# Patient Record
Sex: Female | Born: 1990 | Race: Black or African American | Hispanic: No | Marital: Single | Smoking: Never smoker
Health system: Southern US, Community
[De-identification: ages and names within clinical notes are randomized; demographics above are authoritative.]

## PROBLEM LIST (undated history)

## (undated) HISTORY — PX: HERNIA REPAIR: SHX51

## (undated) HISTORY — PX: TOE SURGERY: SHX1073

---
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
## 2010-06-28 ENCOUNTER — Emergency Department (HOSPITAL_COMMUNITY)
Admission: EM | Admit: 2010-06-28 | Discharge: 2010-06-28 | Disposition: A | Discharge status: Home / Self Care | Payer: Self-pay | Attending: Emergency Medicine | Admitting: Emergency Medicine

## 2010-06-28 DIAGNOSIS — M25569 Pain in unspecified knee: Secondary | ICD-10-CM | POA: Insufficient documentation

## 2010-06-28 DIAGNOSIS — M25469 Effusion, unspecified knee: Secondary | ICD-10-CM | POA: Insufficient documentation

## 2010-06-28 DIAGNOSIS — IMO0002 Reserved for concepts with insufficient information to code with codable children: Secondary | ICD-10-CM | POA: Insufficient documentation

## 2010-06-28 DIAGNOSIS — Y929 Unspecified place or not applicable: Secondary | ICD-10-CM | POA: Insufficient documentation

## 2010-06-28 DIAGNOSIS — M25669 Stiffness of unspecified knee, not elsewhere classified: Secondary | ICD-10-CM | POA: Insufficient documentation

## 2010-06-28 DIAGNOSIS — X500XXA Overexertion from strenuous movement or load, initial encounter: Secondary | ICD-10-CM | POA: Insufficient documentation

## 2010-07-15 NOTE — Discharge Summary (Signed)
Suzanne Miles, SIPPEL NO.:  [id number]   MEDICAL RECORD NO.:  [id number]          PATIENT TYPE:  INP   LOCATION:  5125                         FACILITY:  [facility]   PHYSICIAN:  Altha Harm, MDDATE OF BIRTH:  03-07-1990   DATE OF ADMISSION:  11/11/2007  DATE OF DISCHARGE:  11/16/2007                               DISCHARGE SUMMARY   DISCHARGE DISPOSITION:  Home.   FINAL DISCHARGE DIAGNOSES:  1. Bilateral acute pyelonephritis.  2. E. coli bacteremia secondary to bilateral pyelonephritis.  3. Dehydration.  4. Electrolyte imbalance.  5. Fever, resolved.   DISCHARGE MEDICATIONS:  Ciprofloxacin 500 mg p.o. b.i.d. times 8 days.   CONSULTANTS:  None.   PROCEDURE:  None.   DIAGNOSTIC STUDIES:  1. Chest x-ray 2 view which shows no acute abnormality, done on      admission.  2. CT abdomen and pelvis done on admission which shows bilateral      pyelonephritis.  No trace right pleural effusions.  3. Repeat chest x-ray 2 view which shows low lung volumes.  Bilateral      pleural effusions and sloughing airspace opacity in the posterior      lower lobes suspicious for bilateral pneumonia although lower lobe      atelectasis is a possibility  4. CT abdomen and pelvis done on September 15 which shows increased      bilateral pleural effusions with new, fluffy alveolar opacities in      the posterior lower lobe suspicious for pneumonia, sequelae of      aspiration or atelectasis are less likely considerations.  5. Bilateral pyelonephritis with pararenal stranding.  No abscess.  6. Increased perihepatic and right pericolic gutter ascites.      Presumably reactive to #2.  No acute findings in the pelvis.   PERTINENT LABORATORY STUDIES:  1. Blood culture done on admission which shows positive E-coli which      is pansensitive, in particular to ciprofloxacin.  2. Urine culture showing E. coli sensitive to ciprofloxacin.  White      blood cell count 8.1, hemoglobin  9.5, hematocrit 28.4, platelet      count 251, sodium 138, potassium 3.8, chloride 106, bicarb 25, BUN      3, creatinine 1.06 at the day of discharge.   HOSPITAL COURSE:  The patient was admitted and started on Fortaz and  ciprofloxacin for presumably pyelonephritis.   Blood cultures were drawn which showed bacteremia.  The patient received  a total of 6 days of antibiotics which were appropriate as the cultures  were sensitive to ciprofloxacin.  The patient has been afebrile for last  24 hours and is being discharged home on ciprofloxacin 500 mg b.i.d. to  complete another 8 days.  The patient has been instructed that she needs  to keep very well hydrated, and that if she has any worsening CVA  tenderness or fever she is to report to her physician or report back to  the hospital.  The patient's chest x-ray did show opacities; however,  the patient has no respiratory symptoms and this should be treated  with  the regimen of antibiotics including both Fortaz and ciprofloxacin.  The  patient at this time is without any need for oxygen and does not  demonstrate any tachypnea.  She is ambulating without any difficulty.   Anemia.  The patient does demonstrate anemia which appears to be  normocytic in dynamics.  This should be worked up as an outpatient.  It  is likely due to the patient who is menstruating.  However, I would  recommend that the patient has a workup as an outpatient to further  elucidate the anemia.   DIET:  Dietary restrictions are none.  However, the patient is  encouraged to drink lots of clear fluids.   ACTIVITY:  Physical restrictions:  None.   FOLLOW UP:  The patient is to follow up with her GYN who serves as her  primary care physician in [facility] within 1 week or as needed.      Altha Harm, MD  Electronically Signed     MAM/MEDQ  D:  11/16/2007  T:  11/16/2007  Job:  [id number]

## 2010-07-15 NOTE — Group Therapy Note (Signed)
NAME:  Suzanne Miles, Suzanne Miles NO.:  [id number]   MEDICAL RECORD NO.:  [id number]          PATIENT TYPE:  INP   LOCATION:  5125                         FACILITY:  [facility]   PHYSICIAN:  Eduard Clos, MDDATE OF BIRTH:  [birth year]                                 PROGRESS NOTE   An 20 year old female with no significant past medical history who  presented with abdominal pain, nausea and vomiting.  As mentioned, the  patient had a UA which showed  a large amount of leukocyte esterase and  CT abdomen and pelvis showed bilateral pyelonephritis.  The patient also  had a fever of 103 on admission.  The patient was admitted to the  medical floor, started on IV fluids and started on empiric antibiotics.  Blood cultures were drawn and showed E. coli which was sensitive to  Cipro.  Urine cultures also grew E. coli.  Repeat blood cultures have  shown no growth so far.  The patient is still having fevers of 102.  A  repeat CAT scan of the abdomen and pelvis has been ordered with contrast  and a chest x-ray will also be ordered to see if there is any other  source of fever.  We will continue present antibiotics and further  recommendation after CAT scan of abdomen and pelvis and chest x-ray.   FINAL DIAGNOSES:  1. Escherichia coli bacteremia secondary to bilateral pyelonephritis.  2. Dehydration.  3. Electrolyte imbalance.  4. Fever.   PLAN:  Further recommendation based on the CAT scan of the abdomen and pelvis  findings, along with the chest x-ray findings.  Presently, we will  continue the antibiotics.  She is on Cipro and ceftizoxime.  If the  fever persists and we will need infectious disease consult for further  recommendations.      Eduard Clos, MD  Electronically Signed     ANK/MEDQ  D:  11/15/2007  T:  11/15/2007  Job:  [id number]

## 2010-07-15 NOTE — Group Therapy Note (Signed)
NAME:  MUREL, WIGLE NO.:  [id number]   MEDICAL RECORD NO.:  [id number]          PATIENT TYPE:  INP   LOCATION:  5125                         FACILITY:  [facility]   PHYSICIAN:  Eduard Clos, MDDATE OF BIRTH:  22-Jun-1990                                 PROGRESS NOTE   An 20 year old female with no significant past medical history,  presented with complaints of abdominal pain, nausea and vomiting.  The  patient also had a fever of 103.1.  The patient had a UA which showed a  large amount of leukocyte esterase and rbc's and many bacteria.  The  patient had a CT scan of the abdomen and pelvis which showed bilateral  pyelonephritis.  Patient admitted to a medical floor, was started on  empiric antibiotics.  Blood cultures grew E. coli which was sensitive to  Cipro.  Urine cultures also grew E. coli.  At this time the patient is  still having fever but repeat blood cultures did not show any growth so  far.  Since the patient is still having fevers around 102, a repeat CT  scan of the abdomen and pelvis with contrast will be ordered now to  assess for any abscess or any worsening of the pyelonephritis.  We will  also order a chest x-ray to rule out any other source of fever.   FINAL DIAGNOSES:  1. Escherichia coli bacteremia from bilateral pyelonephritis.  2. Persistent fever.  3. Electrolyte imbalance, corrected.   PLAN:  A CT of the abdomen and pelvis with contrast and a portable chest x-ray  will be ordered.      Eduard Clos, MD  Electronically Signed     ANK/MEDQ  D:  11/15/2007  T:  11/15/2007  Job:  [id number]

## 2010-07-15 NOTE — H&P (Signed)
[name], [name] NO.:  [id number]   MEDICAL RECORD NO.:  [id number]          PATIENT TYPE:  EMS   LOCATION:  MINO                         FACILITY:  [facility]   PHYSICIAN:  Ladell Pier, M.D.   DATE OF BIRTH:  [birth year]   DATE OF ADMISSION:  11/11/2007  DATE OF DISCHARGE:                              HISTORY & PHYSICAL   CHIEF COMPLAINT:  Kidney infection.   HISTORY OF PRESENT ILLNESS:  The patient is an 20 year old African  American female without any significant past medical history.  The  patient states that she normally sees OB/GYN in [facility].  She had a C-  section on October 04, 2007.  For the past 3 days she has been having  abdominal pain, nausea, vomiting.  She went to see her OB/GYN and was  told she had a kidney infection, and was told to go to [facility]  [place].  She came here to [facility].  She complains  of abdominal pain, chest pain when she coughs; no shortness of breath.  She complains of tenderness in her back.   PAST MEDICAL HISTORY:  None, except for recent C-section on October 04, 2007.   FAMILY HISTORY:  Mother and father both healthy.   SOCIAL HISTORY:  No tobacco or alcohol use.  She is single.  She just  had child by C-section on October 04, 2007.  She recently graduated from  high school.   MEDICATIONS:  None.   ALLERGIES:  NO KNOWN DRUG ALLERGIES.   REVIEW OF SYSTEMS:  As per stated in the HPI.   PHYSICAL EXAMINATION:  Temperature 103.1, blood pressure 107/62, pulse  125, respirations 26, pulse oximetry 98% on room air.  HEENT:  Normocephalic, atraumatic.  Pupils reactive to light.  Throat  without erythema.  CARDIOVASCULAR:  She is tachycardic.  LUNGS:  Clear bilaterally.  No wheezes, rhonchi or rales.  ABDOMEN:  Soft, diffusely tender with positive bowel sounds.  Positive  for CVA tenderness bilaterally.  EXTREMITIES:  There are 2+ DP pulses bilaterally.  No edema.   LABS:  Urinalysis with large  leukocyte esterase.  Moderate amount of  hemoglobin.  7-10 RBCs and many bacteria.  WBC 12.0, hemoglobin 11, MCV  82.6, platelets 184.  Sodium 136, potassium 3.3, chloride 101, CO2 26,  glucose 160, BUN 11, creatinine 1.25.   ASSESSMENT AND PLAN:  1. Acute pyelonephritis.  2. Abdominal pain.  3. Nausea and vomiting.  4. Leukocytosis.  5. Normocytic anemia.  6. Tachycardia.  7. Mildly elevated glucose.   Will admit the patient to the hospital; place on IV fluids at 125 mL an  hour.  Will get blood cultures x2. Will get urine cultures.  Will get a  CT scan of the abdomen and pelvis, secondary to her recently having C-  section and diffuse tenderness in the abdomen.  Will replete her  potassium.  Will give her Dilaudid or oxycodone for pain control.  We  will also get a chest x-ray with her complaint of chest pain when she  coughs.  Ladell Pier, M.D.  Electronically Signed     NJ/MEDQ  D:  11/11/2007  T:  11/11/2007  Job:  [id number]

## 2010-07-23 ENCOUNTER — Emergency Department (HOSPITAL_COMMUNITY)
Admission: EM | Admit: 2010-07-23 | Discharge: 2010-07-24 | Disposition: A | Discharge status: Home / Self Care | Payer: Self-pay | Attending: Emergency Medicine | Admitting: Emergency Medicine

## 2010-07-23 DIAGNOSIS — N72 Inflammatory disease of cervix uteri: Secondary | ICD-10-CM | POA: Insufficient documentation

## 2010-07-23 DIAGNOSIS — N898 Other specified noninflammatory disorders of vagina: Secondary | ICD-10-CM | POA: Insufficient documentation

## 2010-07-23 DIAGNOSIS — O2390 Unspecified genitourinary tract infection in pregnancy, unspecified trimester: Secondary | ICD-10-CM | POA: Insufficient documentation

## 2010-07-23 DIAGNOSIS — R109 Unspecified abdominal pain: Secondary | ICD-10-CM | POA: Insufficient documentation

## 2010-07-23 DIAGNOSIS — O9989 Other specified diseases and conditions complicating pregnancy, childbirth and the puerperium: Secondary | ICD-10-CM | POA: Insufficient documentation

## 2010-07-24 LAB — WET PREP, GENITAL

## 2010-07-24 LAB — URINALYSIS, ROUTINE W REFLEX MICROSCOPIC
Bilirubin Urine: NEGATIVE
Nitrite: NEGATIVE
Specific Gravity, Urine: 1.024 (ref 1.005–1.030)
Urobilinogen, UA: 0.2 mg/dL (ref 0.0–1.0)

## 2010-07-24 LAB — HCG, QUANTITATIVE, PREGNANCY: hCG, Beta Chain, Quant, S: 145 m[IU]/mL — ABNORMAL HIGH (ref <5)

## 2010-07-24 LAB — POCT PREGNANCY, URINE: Preg Test, Ur: NEGATIVE

## 2010-07-24 LAB — URINE MICROSCOPIC-ADD ON

## 2010-07-25 LAB — GC/CHLAMYDIA PROBE AMP, GENITAL
Chlamydia, DNA Probe: NEGATIVE
GC Probe Amp, Genital: NEGATIVE

## 2010-12-01 LAB — BASIC METABOLIC PANEL
BUN: 2 — ABNORMAL LOW
Calcium: 8.2 — ABNORMAL LOW
Creatinine, Ser: 1.06
Creatinine, Ser: 1.14
GFR calc Af Amer: >60
GFR calc non Af Amer: >60
Potassium: 3.7

## 2010-12-01 LAB — CBC
RBC: 3.58 — ABNORMAL LOW
WBC: 8.1

## 2010-12-02 LAB — POCT RAPID STREP A: Streptococcus, Group A Screen (Direct): NEGATIVE

## 2010-12-02 LAB — POCT INFECTIOUS MONO SCREEN: Mono Screen: POSITIVE — AB

## 2010-12-03 LAB — DIFFERENTIAL
Eosinophils Relative: 0
Lymphocytes Relative: 7 — ABNORMAL LOW
Lymphs Abs: 0.8
Monocytes Absolute: 0.7
Monocytes Relative: 6

## 2010-12-03 LAB — FERRITIN: Ferritin: 197 (ref 10–291)

## 2010-12-03 LAB — CBC
HCT: 32.8 — ABNORMAL LOW
HCT: 33.7 — ABNORMAL LOW
Hemoglobin: 10.9 — ABNORMAL LOW
Hemoglobin: 11 — ABNORMAL LOW
Hemoglobin: 9.6 — ABNORMAL LOW
MCHC: 33.5
MCV: 80.6
MCV: 82.4
MCV: 82.6
RBC: 3.54 — ABNORMAL LOW
RBC: 4.08
RDW: 13.7
WBC: 12 — ABNORMAL HIGH

## 2010-12-03 LAB — BASIC METABOLIC PANEL
CO2: 25
CO2: 26
Calcium: 7.8 — ABNORMAL LOW
Calcium: 8.3 — ABNORMAL LOW
Chloride: 101
Chloride: 102
Creatinine, Ser: 1.15
Creatinine, Ser: 1.25 — ABNORMAL HIGH
GFR calc Af Amer: >60
GFR calc Af Amer: >60
GFR calc non Af Amer: 56 — ABNORMAL LOW
Glucose, Bld: 132 — ABNORMAL HIGH
Sodium: 134 — ABNORMAL LOW

## 2010-12-03 LAB — COMPREHENSIVE METABOLIC PANEL
Alkaline Phosphatase: 92
BUN: 11
Creatinine, Ser: 1.39 — ABNORMAL HIGH
Glucose, Bld: 125 — ABNORMAL HIGH
Potassium: 4.8
Total Bilirubin: 1.9 — ABNORMAL HIGH
Total Protein: 6.1

## 2010-12-03 LAB — CULTURE, BLOOD (ROUTINE X 2)

## 2010-12-03 LAB — IRON AND TIBC: Iron: <10 — ABNORMAL LOW

## 2010-12-03 LAB — URINALYSIS, ROUTINE W REFLEX MICROSCOPIC
Nitrite: NEGATIVE
Specific Gravity, Urine: 1.016
Urobilinogen, UA: 0.2

## 2010-12-03 LAB — URINE CULTURE

## 2010-12-03 LAB — RETICULOCYTES: Retic Count, Absolute: 37.5

## 2010-12-03 LAB — URINE MICROSCOPIC-ADD ON

## 2010-12-03 LAB — VITAMIN B12: Vitamin B-12: 643 (ref 211–911)

## 2010-12-03 LAB — MAGNESIUM: Magnesium: 1.8

## 2010-12-03 LAB — GC/CHLAMYDIA PROBE AMP, GENITAL: GC Probe Amp, Genital: NEGATIVE

## 2011-01-21 ENCOUNTER — Emergency Department (HOSPITAL_COMMUNITY): Payer: No Typology Code available for payment source

## 2011-01-21 ENCOUNTER — Emergency Department (HOSPITAL_COMMUNITY)
Admission: EM | Admit: 2011-01-21 | Discharge: 2011-01-21 | Disposition: A | Discharge status: Home / Self Care | Payer: No Typology Code available for payment source | Attending: Emergency Medicine | Admitting: Emergency Medicine

## 2011-01-21 ENCOUNTER — Encounter: Payer: Self-pay | Admitting: Emergency Medicine

## 2011-01-21 DIAGNOSIS — S0990XA Unspecified injury of head, initial encounter: Secondary | ICD-10-CM

## 2011-01-21 DIAGNOSIS — Z9889 Other specified postprocedural states: Secondary | ICD-10-CM | POA: Insufficient documentation

## 2011-01-21 DIAGNOSIS — M542 Cervicalgia: Secondary | ICD-10-CM | POA: Insufficient documentation

## 2011-01-21 DIAGNOSIS — S335XXA Sprain of ligaments of lumbar spine, initial encounter: Secondary | ICD-10-CM | POA: Insufficient documentation

## 2011-01-21 DIAGNOSIS — M545 Low back pain, unspecified: Secondary | ICD-10-CM | POA: Insufficient documentation

## 2011-01-21 DIAGNOSIS — R51 Headache: Secondary | ICD-10-CM | POA: Insufficient documentation

## 2011-01-21 DIAGNOSIS — S39012A Strain of muscle, fascia and tendon of lower back, initial encounter: Secondary | ICD-10-CM

## 2011-01-21 MED ORDER — IBUPROFEN 800 MG PO TABS: 800.0000 mg | ORAL_TABLET | Freq: Three times a day (TID) | ORAL | Status: DC

## 2011-01-21 MED ORDER — IBUPROFEN 800 MG PO TABS
800.0000 mg | ORAL_TABLET | Freq: Once | ORAL | Status: AC
  Administered 2011-01-21: 800 mg via ORAL
  Filled 2011-01-21: qty 1

## 2011-01-21 MED ORDER — DIAZEPAM 5 MG PO TABS: 5.0000 mg | ORAL_TABLET | Freq: Three times a day (TID) | ORAL | Status: DC | PRN
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
## 2011-01-22 NOTE — ED Provider Notes (Signed)
Medical screening examination/treatment/procedure(s) were performed by non-physician practitioner and as supervising physician I was immediately available for consultation/collaboration.  Cyndra Numbers, MD 01/22/11 1010

## 2011-01-23 ENCOUNTER — Emergency Department (HOSPITAL_COMMUNITY): Payer: Self-pay

## 2011-01-23 ENCOUNTER — Emergency Department (HOSPITAL_COMMUNITY)
Admission: EM | Admit: 2011-01-23 | Discharge: 2011-01-24 | Disposition: A | Discharge status: Home / Self Care | Payer: Self-pay | Attending: Emergency Medicine | Admitting: Emergency Medicine

## 2011-01-23 ENCOUNTER — Encounter (HOSPITAL_COMMUNITY): Payer: Self-pay | Admitting: Emergency Medicine

## 2011-01-23 DIAGNOSIS — R1110 Vomiting, unspecified: Secondary | ICD-10-CM | POA: Insufficient documentation

## 2011-01-23 DIAGNOSIS — R5381 Other malaise: Secondary | ICD-10-CM | POA: Insufficient documentation

## 2011-01-23 DIAGNOSIS — R070 Pain in throat: Secondary | ICD-10-CM | POA: Insufficient documentation

## 2011-01-23 DIAGNOSIS — J069 Acute upper respiratory infection, unspecified: Secondary | ICD-10-CM | POA: Insufficient documentation

## 2011-01-23 DIAGNOSIS — IMO0001 Reserved for inherently not codable concepts without codable children: Secondary | ICD-10-CM | POA: Insufficient documentation

## 2011-01-23 DIAGNOSIS — R059 Cough, unspecified: Secondary | ICD-10-CM | POA: Insufficient documentation

## 2011-01-23 DIAGNOSIS — R630 Anorexia: Secondary | ICD-10-CM | POA: Insufficient documentation

## 2011-01-23 DIAGNOSIS — R509 Fever, unspecified: Secondary | ICD-10-CM | POA: Insufficient documentation

## 2011-01-23 DIAGNOSIS — R05 Cough: Secondary | ICD-10-CM | POA: Insufficient documentation

## 2011-01-23 MED ORDER — ONDANSETRON 4 MG PO TBDP
8.0000 mg | ORAL_TABLET | Freq: Once | ORAL | Status: AC
  Administered 2011-01-24: 8 mg via ORAL
  Filled 2011-01-23: qty 2

## 2011-01-23 NOTE — ED Notes (Signed)
PT. REPORTS PERSISTENT PRODUCTIVE COUGH WITH HEADACHE AND SORE THROAT ONSET LAST NIGHT WITH LOW GRADE FEVER .

## 2011-01-23 NOTE — ED Provider Notes (Signed)
History     CSN: [id number] Arrival date & time: 01/23/2011  8:15 PM   First MD Initiated Contact with Patient 01/23/11 2314      Chief Complaint  Patient presents with  . Cough    (Consider location/radiation/quality/duration/timing/severity/associated sxs/prior treatment) HPI History provided by pt.   Pt has had painful cough, sore throat and fever since yesterday.   Associated w/ body aches, generalized weakness and fatigue.  2 episodes of vomiting today.  Denies abd pain/diarrhea/GU sx.  Not tolerating fluids.  No known sick contacts.  History reviewed. No pertinent past medical history.  Past Surgical History  Procedure Date  . Hernia repair   . Hernia repair   . Toe surgery     No family history on file.  History  Substance Use Topics  . Smoking status: Never Smoker   . Smokeless tobacco: Not on file  . Alcohol Use: Yes     occasional    OB History    Grav Para Term Preterm Abortions TAB SAB Ect Mult Living                  Review of Systems  All other systems reviewed and are negative.    Allergies  Review of patient's allergies indicates no known allergies.  Home Medications  No current outpatient prescriptions on file.  BP 116/69  Pulse 107  Temp(Src) 100.6 F (38.1 C) (Oral)  Resp 14  SpO2 98%  Physical Exam  Nursing note and vitals reviewed. Constitutional: She is oriented to person, place, and time. She appears well-developed and well-nourished. No distress.  HENT:  Head: No trismus in the jaw.  Right Ear: Tympanic membrane, external ear and ear canal normal.  Left Ear: Tympanic membrane, external ear and ear canal normal.  Mouth/Throat: Uvula is midline and mucous membranes are normal. No oropharyngeal exudate, posterior oropharyngeal edema or posterior oropharyngeal erythema.       Posterior pharynx poorly visualized.  Tonsillar erythema w/out edema or exudate.    Eyes:       Normal appearance  Neck: Normal range of motion. Neck  supple.  Cardiovascular: Normal rate and regular rhythm.   Pulmonary/Chest: Effort normal and breath sounds normal.  Lymphadenopathy:    She has no cervical adenopathy.  Neurological: She is alert and oriented to person, place, and time.  Skin: Skin is warm and dry. No rash noted.  Psychiatric: She has a normal mood and affect. Her behavior is normal.    ED Course  Procedures (including critical care time)  Labs Reviewed - No data to display Dg Chest 2 View  01/23/2011  *RADIOLOGY REPORT*  Clinical Data: Cough, fever, chest pain, nausea  CHEST - 2 VIEW  Comparison: 11/15/2007  Findings: Normal heart size, mediastinal contours, and pulmonary vascularity. Minimal chronic peribronchial thickening without infiltrate or effusion. No pneumothorax. Bones unremarkable.  IMPRESSION: No acute abnormalities.  Original Report Authenticated By: Lollie Marrow, M.D.     1. Viral URI       MDM  Healthy 20yo pt presents w/ fever, cough, sore throat, body aches.  Pt in NAD, no coughing, lungs clear, tachycardic.  CXR neg for pneumonia.  Tachycardia likely d/t fever and dehydration (has not had any fluids today and vomited twice).  Will treat for viral illness. Pushed oral fluids in ED but pt took only a few sips every time I checked on her.  She received 800mg  ibuprofen for fever and an albuterol inhaler for cough.  D/c'd  home w/ ibuprofen and recommended rest and hydration.  Return precautions discussed.        Arie Sabina [place], [place] 01/24/11 1216  Medical screening examination/treatment/procedure(s) were performed by non-physician practitioner and as supervising physician I was immediately available for consultation/collaboration.  Sunnie Nielsen, MD 01/24/11 2251

## 2011-01-24 MED ORDER — ALBUTEROL SULFATE HFA 108 (90 BASE) MCG/ACT IN AERS
2.0000 | INHALATION_SPRAY | Freq: Once | RESPIRATORY_TRACT | Status: AC
  Administered 2011-01-24: 2 via RESPIRATORY_TRACT
  Filled 2011-01-24: qty 6.7

## 2011-01-24 MED ORDER — IBUPROFEN 800 MG PO TABS: 800.0000 mg | ORAL_TABLET | Freq: Three times a day (TID) | ORAL | Status: AC

## 2011-01-24 MED ORDER — IBUPROFEN 800 MG PO TABS
800.0000 mg | ORAL_TABLET | Freq: Once | ORAL | Status: AC
  Administered 2011-01-24: 800 mg via ORAL
  Filled 2011-01-24: qty 1

## 2011-03-31 ENCOUNTER — Encounter (HOSPITAL_COMMUNITY): Payer: Self-pay | Admitting: Emergency Medicine

## 2011-03-31 ENCOUNTER — Emergency Department (HOSPITAL_COMMUNITY)
Admission: EM | Admit: 2011-03-31 | Discharge: 2011-03-31 | Disposition: A | Discharge status: Home / Self Care | Payer: Self-pay | Attending: Emergency Medicine | Admitting: Emergency Medicine

## 2011-03-31 DIAGNOSIS — F489 Nonpsychotic mental disorder, unspecified: Secondary | ICD-10-CM | POA: Insufficient documentation

## 2011-03-31 DIAGNOSIS — S71009A Unspecified open wound, unspecified hip, initial encounter: Secondary | ICD-10-CM | POA: Insufficient documentation

## 2011-03-31 DIAGNOSIS — S71109A Unspecified open wound, unspecified thigh, initial encounter: Secondary | ICD-10-CM | POA: Insufficient documentation

## 2011-03-31 DIAGNOSIS — Z7289 Other problems related to lifestyle: Secondary | ICD-10-CM

## 2011-03-31 DIAGNOSIS — X789XXA Intentional self-harm by unspecified sharp object, initial encounter: Secondary | ICD-10-CM | POA: Insufficient documentation

## 2011-03-31 MED ORDER — BACITRACIN ZINC 500 UNIT/GM EX OINT
1.0000 "application " | TOPICAL_OINTMENT | Freq: Two times a day (BID) | CUTANEOUS | Status: DC
  Administered 2011-03-31: 1 via TOPICAL
  Filled 2011-03-31: qty 0.9

## 2011-03-31 MED ORDER — ACETAMINOPHEN 325 MG PO TABS
650.0000 mg | ORAL_TABLET | Freq: Once | ORAL | Status: AC
  Administered 2011-03-31: 650 mg via ORAL
  Filled 2011-03-31: qty 2

## 2011-03-31 NOTE — ED Notes (Signed)
ZOX:WRUE<AV> Expected date:03/31/11<BR> Expected time:12:30 AM<BR> Means of arrival:Ambulance<BR> Comments:<BR> Suicidal, cutting
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
## 2011-03-31 NOTE — ED Notes (Signed)
Report received from [facility]

## 2011-04-04 ENCOUNTER — Emergency Department (HOSPITAL_COMMUNITY)
Admission: EM | Admit: 2011-04-04 | Discharge: 2011-04-04 | Disposition: A | Discharge status: Home / Self Care | Payer: No Typology Code available for payment source | Attending: Emergency Medicine | Admitting: Emergency Medicine

## 2011-04-04 ENCOUNTER — Encounter (HOSPITAL_COMMUNITY): Payer: Self-pay

## 2011-04-04 DIAGNOSIS — X789XXA Intentional self-harm by unspecified sharp object, initial encounter: Secondary | ICD-10-CM | POA: Insufficient documentation

## 2011-04-04 DIAGNOSIS — IMO0002 Reserved for concepts with insufficient information to code with codable children: Secondary | ICD-10-CM | POA: Insufficient documentation

## 2011-04-04 DIAGNOSIS — F1210 Cannabis abuse, uncomplicated: Secondary | ICD-10-CM

## 2011-04-04 DIAGNOSIS — S71119A Laceration without foreign body, unspecified thigh, initial encounter: Secondary | ICD-10-CM

## 2011-04-04 DIAGNOSIS — S71009A Unspecified open wound, unspecified hip, initial encounter: Secondary | ICD-10-CM | POA: Insufficient documentation

## 2011-04-04 DIAGNOSIS — S71109A Unspecified open wound, unspecified thigh, initial encounter: Secondary | ICD-10-CM | POA: Insufficient documentation

## 2011-04-04 LAB — RAPID URINE DRUG SCREEN, HOSP PERFORMED
Amphetamines: NOT DETECTED
Barbiturates: NOT DETECTED
Benzodiazepines: NOT DETECTED
Cocaine: NOT DETECTED
Opiates: NOT DETECTED
Tetrahydrocannabinol: POSITIVE — AB

## 2011-04-04 LAB — BASIC METABOLIC PANEL
Calcium: 10.1 mg/dL (ref 8.4–10.5)
Chloride: 104 mEq/L (ref 96–112)
Creatinine, Ser: 0.94 mg/dL (ref 0.50–1.10)
GFR calc Af Amer: >90 mL/min (ref >90)
Sodium: 140 mEq/L (ref 135–145)

## 2011-04-04 LAB — BASIC METABOLIC PANEL WITH GFR
BUN: 12 mg/dL (ref 6–23)
CO2: 23 meq/L (ref 19–32)
GFR calc non Af Amer: 86 mL/min — ABNORMAL LOW (ref >90)
Glucose, Bld: 83 mg/dL (ref 70–99)
Potassium: 3.2 meq/L — ABNORMAL LOW (ref 3.5–5.1)

## 2011-04-04 LAB — CBC
HCT: 43.4 % (ref 36.0–46.0)
Hemoglobin: 15.2 g/dL — ABNORMAL HIGH (ref 12.0–15.0)
MCH: 28.6 pg (ref 26.0–34.0)
MCHC: 35 g/dL (ref 30.0–36.0)
MCV: 81.7 fL (ref 78.0–100.0)
Platelets: 283 10*3/uL (ref 150–400)
RBC: 5.31 MIL/uL — ABNORMAL HIGH (ref 3.87–5.11)
RDW: 12.4 % (ref 11.5–15.5)
WBC: 7.7 10*3/uL (ref 4.0–10.5)

## 2011-04-04 LAB — ETHANOL: Alcohol, Ethyl (B): <11 mg/dL (ref 0–11)

## 2011-04-04 MED ORDER — LIDOCAINE-EPINEPHRINE (PF) 1 %-1:200000 IJ SOLN
INTRAMUSCULAR | Status: AC
  Filled 2011-04-04: qty 10

## 2011-04-04 MED ORDER — LIDOCAINE-EPINEPHRINE 1 %-1:100000 IJ SOLN
10.0000 mL | Freq: Once | INTRAMUSCULAR | Status: AC
  Administered 2011-04-04: 10 mL
  Filled 2011-04-04: qty 10

## 2011-04-04 NOTE — ED Notes (Signed)
ACT into see 
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
## 2011-04-04 NOTE — ED Notes (Signed)
Pt belongings, at nurses desk, pt refused to remove head scarf and panties, stating she wants to talk to md first.
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
## 2011-04-04 NOTE — ED Provider Notes (Signed)
Medical screening examination/treatment/procedure(s) were performed by non-physician practitioner and as supervising physician I was immediately available for consultation/collaboration.   Hedda Crumbley, MD 04/04/11 1603 

## 2011-04-04 NOTE — ED Notes (Signed)
Pt's father is Riann Oman [phone] wants to be called so that he can give background info, pt granted request but nobody answered when number was called, message not left.
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
## 2011-04-04 NOTE — [facility] Counselor (Signed)
Per telepsych, IVC has been reversed and recommendation for pt discharge. RN and EDP notified. EDP in agreement.

## 2011-04-04 NOTE — ED Notes (Signed)
Patient discharged home via private vehicle with sister. Denies SI/HI. Instructions reviewed. Pt. Verbalizes understanding.

## 2011-04-04 NOTE — ED Notes (Signed)
Patient rang call bell asking for RN. When checked on patient, she stated she was told by telepsych MD that she would be discharged. Explained process of consultation and results not received yet by [facility] Explained to pt that she was under IVC and could not be discharged until results received and our EDP made final decision. Pt. Said she had to leave and she had been here over eight hours. She said that was long enough. She asked to speak with EDP. Dr. Oletta Lamas notified. He declined to talk with patient at this time. Will continue to monitor and wait for telepsych results.

## 2013-03-29 ENCOUNTER — Emergency Department (HOSPITAL_BASED_OUTPATIENT_CLINIC_OR_DEPARTMENT_OTHER)
Admission: EM | Admit: 2013-03-29 | Discharge: 2013-03-29 | Disposition: A | Discharge status: Home / Self Care | Payer: No Typology Code available for payment source | Attending: Emergency Medicine | Admitting: Emergency Medicine

## 2013-03-29 ENCOUNTER — Encounter (HOSPITAL_BASED_OUTPATIENT_CLINIC_OR_DEPARTMENT_OTHER): Payer: Self-pay | Admitting: Emergency Medicine

## 2013-03-29 DIAGNOSIS — B079 Viral wart, unspecified: Secondary | ICD-10-CM | POA: Insufficient documentation

## 2013-03-29 DIAGNOSIS — B078 Other viral warts: Secondary | ICD-10-CM

## 2013-03-29 NOTE — ED Notes (Signed)
Pt c/o " lump" to left hand x 1 month

## 2013-03-29 NOTE — ED Provider Notes (Signed)
Medical screening examination/treatment/procedure(s) were performed by non-physician practitioner and as supervising physician I was immediately available for consultation/collaboration.  EKG Interpretation   None         Gwyneth SproutWhitney Alfreddie Consalvo, MD 03/29/13 2249

## 2013-03-29 NOTE — Discharge Instructions (Signed)
Warts Warts are a common viral infection. They are most commonly caused by the human papillomavirus (HPV). Warts can occur at all ages. However, they occur most frequently in older children and infrequently in the elderly. Warts may be single or multiple. Location and size varies. Warts can be spread by scratching the wart and then scratching normal skin. The life cycle of warts varies. However, most will disappear over many months to a couple years. Warts commonly do not cause problems (asymptomatic) unless they are over an area of pressure, such as the bottom of the foot. If they are large enough, they may cause pain with walking. DIAGNOSIS  Warts are most commonly diagnosed by their appearance. Tissue samples (biopsies) are not required unless the wart looks abnormal. Most warts have a rough surface, are round, oval, or irregular, and are skin-colored to light yellow, brown, or gray. They are generally less than  inch (1.3 cm), but they can be any size. TREATMENT   Observation or no treatment.  Freezing with liquid nitrogen.  High heat (cautery).  Boosting the body's immunity to fight off the wart (immunotherapy using Candida antigen).  Laser surgery.  Application of various irritants and solutions. HOME CARE INSTRUCTIONS  Follow your caregiver's instructions. No special precautions are necessary. Often, treatment may be followed by a return (recurrence) of warts. Warts are generally difficult to treat and get rid of. If treatment is done in a clinic setting, usually more than 1 treatment is required. This is usually done on only a monthly basis until the wart is completely gone. SEEK IMMEDIATE MEDICAL CARE IF: The treated skin becomes red, puffy (swollen), or painful. Document Released: 11/26/2004 Document Revised: 06/13/2012 Document Reviewed: 05/24/2009 Exit[employer] Patient Information 2014 [employer].  

## 2013-03-29 NOTE — ED Provider Notes (Signed)
CSN: [id number]     Arrival date & time 03/29/13  1624 History   First MD Initiated Contact with Patient 03/29/13 1634     Chief Complaint  Patient presents with  . Hand Pain   (Consider location/radiation/quality/duration/timing/severity/associated sxs/prior Treatment) HPI Comments: Patient here with "lump" to left dorsal hand - she states that she first noticed this about 1 month ago and that it has steadily increased in size.  She reports no previous history of warts in the past, denies anyone else with similar.  Patient is a 23 y.o. female presenting with hand pain. The history is provided by the patient. No language interpreter was used.  Hand Pain This is a new problem. The current episode started more than 1 month ago. The problem occurs constantly. The problem has been unchanged. Pertinent negatives include no abdominal pain, arthralgias, chills, congestion, fever, joint swelling, myalgias, neck pain, numbness, urinary symptoms or weakness. Nothing aggravates the symptoms. She has tried nothing for the symptoms. The treatment provided no relief.    History reviewed. No pertinent past medical history. Past Surgical History  Procedure Laterality Date  . Hernia repair    . Hernia repair    . Toe surgery     History reviewed. No pertinent family history. History  Substance Use Topics  . Smoking status: Never Smoker   . Smokeless tobacco: Not on file  . Alcohol Use: Yes     Comment: occasional   OB History   Grav Para Term Preterm Abortions TAB SAB Ect Mult Living                 Review of Systems  Constitutional: Negative for fever and chills.  HENT: Negative for congestion.   Gastrointestinal: Negative for abdominal pain.  Musculoskeletal: Negative for arthralgias, joint swelling, myalgias and neck pain.  Neurological: Negative for weakness and numbness.  All other systems reviewed and are negative.    Allergies  Review of patient's allergies indicates no known  allergies.  Home Medications  No current outpatient prescriptions on file. BP 123/78  Pulse 100  Temp(Src) 98.9 F (37.2 C) (Oral)  Resp 16  Ht 5\' 8"  (1.727 m)  Wt 130 lb (58.968 kg)  BMI 19.77 kg/m2  SpO2 100%  LMP 03/08/2013 Physical Exam  Nursing note and vitals reviewed. Constitutional: She is oriented to person, place, and time. She appears well-developed and well-nourished. No distress.  HENT:  Head: Normocephalic and atraumatic.  Mouth/Throat: Oropharynx is clear and moist.  Eyes: Conjunctivae are normal. No scleral icterus.  Pulmonary/Chest: Effort normal.  Musculoskeletal: Normal range of motion. She exhibits no edema and no tenderness.  Neurological: She is alert and oriented to person, place, and time. She exhibits normal muscle tone. Coordination normal.  Skin: Skin is warm and dry. No rash noted. No erythema. No pallor.  0.5 cm hyperkeratotic hyperpigmented area to dorsum of left hand c/w wart.  Psychiatric: She has a normal mood and affect. Her behavior is normal. Judgment and thought content normal.    ED Course  Procedures (including critical care time) Labs Review Labs Reviewed - No data to display Imaging Review No results found.  EKG Interpretation   None       MDM   1. Common wart    Patient has been referred to dermatology for removal of the wart.  We have no freezing capability here.  Patient agrees with plan though states she may try over the counter wart removal stuff first.  Suzanne Miles, [place] 03/29/13 2234

## 2013-12-31 ENCOUNTER — Other Ambulatory Visit: Payer: Self-pay | Admitting: Emergency Medicine

## 2013-12-31 ENCOUNTER — Emergency Department (HOSPITAL_BASED_OUTPATIENT_CLINIC_OR_DEPARTMENT_OTHER)
Admission: EM | Admit: 2013-12-31 | Discharge: 2013-12-31 | Disposition: A | Discharge status: Home / Self Care | Payer: No Typology Code available for payment source | Attending: Emergency Medicine | Admitting: Emergency Medicine

## 2013-12-31 DIAGNOSIS — R109 Unspecified abdominal pain: Secondary | ICD-10-CM | POA: Insufficient documentation

## 2013-12-31 DIAGNOSIS — R51 Headache: Secondary | ICD-10-CM | POA: Insufficient documentation

## 2013-12-31 DIAGNOSIS — R110 Nausea: Secondary | ICD-10-CM

## 2013-12-31 DIAGNOSIS — R059 Cough, unspecified: Secondary | ICD-10-CM

## 2013-12-31 DIAGNOSIS — Z3202 Encounter for pregnancy test, result negative: Secondary | ICD-10-CM | POA: Insufficient documentation

## 2013-12-31 DIAGNOSIS — Z87440 Personal history of urinary (tract) infections: Secondary | ICD-10-CM | POA: Insufficient documentation

## 2013-12-31 DIAGNOSIS — R05 Cough: Secondary | ICD-10-CM | POA: Insufficient documentation

## 2013-12-31 DIAGNOSIS — R509 Fever, unspecified: Secondary | ICD-10-CM | POA: Insufficient documentation

## 2013-12-31 LAB — URINALYSIS, ROUTINE W REFLEX MICROSCOPIC
BILIRUBIN URINE: NEGATIVE
GLUCOSE, UA: NEGATIVE mg/dL
HGB URINE DIPSTICK: NEGATIVE
KETONES UR: NEGATIVE mg/dL
Leukocytes, UA: NEGATIVE
Nitrite: NEGATIVE
PROTEIN: NEGATIVE mg/dL
Specific Gravity, Urine: 1.028 (ref 1.005–1.030)
UROBILINOGEN UA: 0.2 mg/dL (ref 0.0–1.0)
pH: 6 (ref 5.0–8.0)

## 2013-12-31 LAB — PREGNANCY, URINE: Preg Test, Ur: NEGATIVE

## 2013-12-31 NOTE — ED Provider Notes (Signed)
See Epic downtime paperwork.  Hanley SeamenJohn L Alvine [name], MD 12/31/13 [phone]

## 2014-02-13 ENCOUNTER — Emergency Department (HOSPITAL_BASED_OUTPATIENT_CLINIC_OR_DEPARTMENT_OTHER)
Admission: EM | Admit: 2014-02-13 | Discharge: 2014-02-14 | Discharge status: Against Medical Advice | Payer: Self-pay | Attending: Emergency Medicine | Admitting: Emergency Medicine

## 2014-02-13 ENCOUNTER — Emergency Department (HOSPITAL_BASED_OUTPATIENT_CLINIC_OR_DEPARTMENT_OTHER): Payer: Self-pay

## 2014-02-13 ENCOUNTER — Encounter (HOSPITAL_BASED_OUTPATIENT_CLINIC_OR_DEPARTMENT_OTHER): Payer: Self-pay | Admitting: *Deleted

## 2014-02-13 DIAGNOSIS — X58XXXA Exposure to other specified factors, initial encounter: Secondary | ICD-10-CM | POA: Insufficient documentation

## 2014-02-13 DIAGNOSIS — S6992XA Unspecified injury of left wrist, hand and finger(s), initial encounter: Secondary | ICD-10-CM | POA: Insufficient documentation

## 2014-02-13 DIAGNOSIS — Y929 Unspecified place or not applicable: Secondary | ICD-10-CM | POA: Insufficient documentation

## 2014-02-13 DIAGNOSIS — Y939 Activity, unspecified: Secondary | ICD-10-CM | POA: Insufficient documentation

## 2014-02-13 DIAGNOSIS — Z5320 Procedure and treatment not carried out because of patient's decision for unspecified reasons: Secondary | ICD-10-CM

## 2014-02-13 DIAGNOSIS — Y998 Other external cause status: Secondary | ICD-10-CM | POA: Insufficient documentation

## 2014-02-13 DIAGNOSIS — W19XXXA Unspecified fall, initial encounter: Secondary | ICD-10-CM

## 2014-02-13 DIAGNOSIS — Z9119 Patient's noncompliance with other medical treatment and regimen: Secondary | ICD-10-CM

## 2014-02-13 NOTE — ED Notes (Signed)
Pt c/o left wrist and hand injury x 3 hrs ago

## 2014-03-29 NOTE — ED Provider Notes (Signed)
Pt left before being seen after triage  XR hand negative.   1. Left before treatment completed   2. Aida PufferFall      Marge Vandermeulen, MD 03/29/14 [phone]

## 2015-05-01 IMAGING — CR DG HAND COMPLETE 3+V*L*
3 series · 3 of 3 positions shown · non-contrast
Comparison: None currently available

CLINICAL DATA: Fall with third through fifth finger pain. Initial
encounter

EXAM:
LEFT HAND - COMPLETE 3+ VIEW

[x hand pa left]
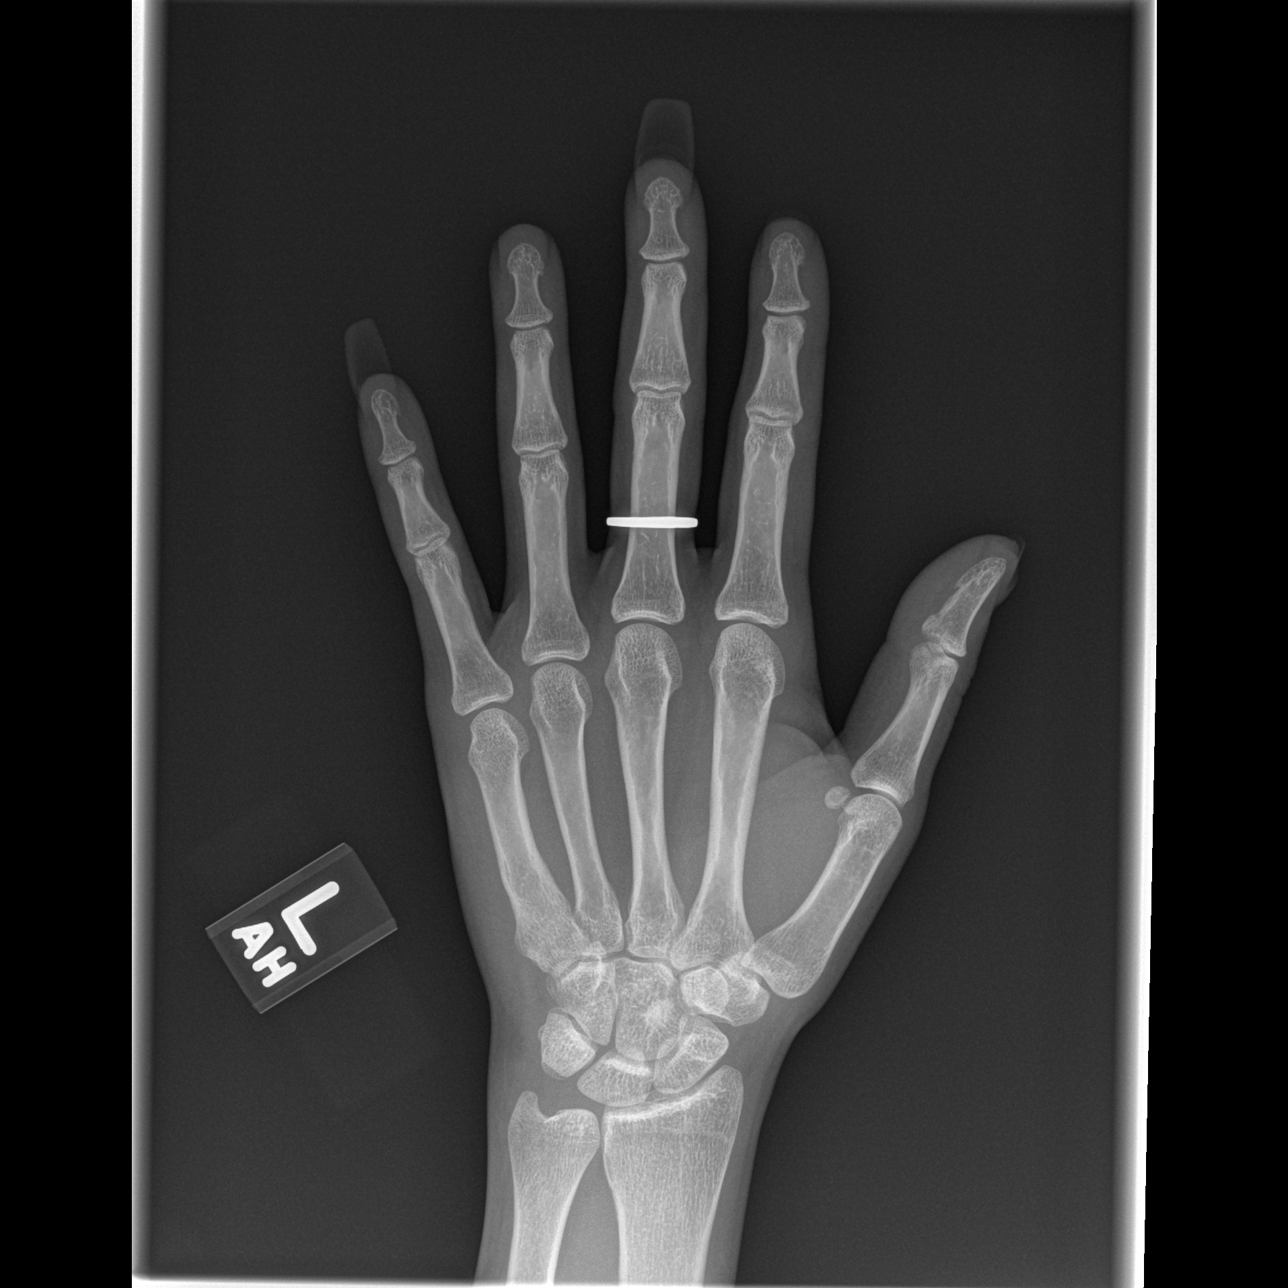

[x hand oblique left]
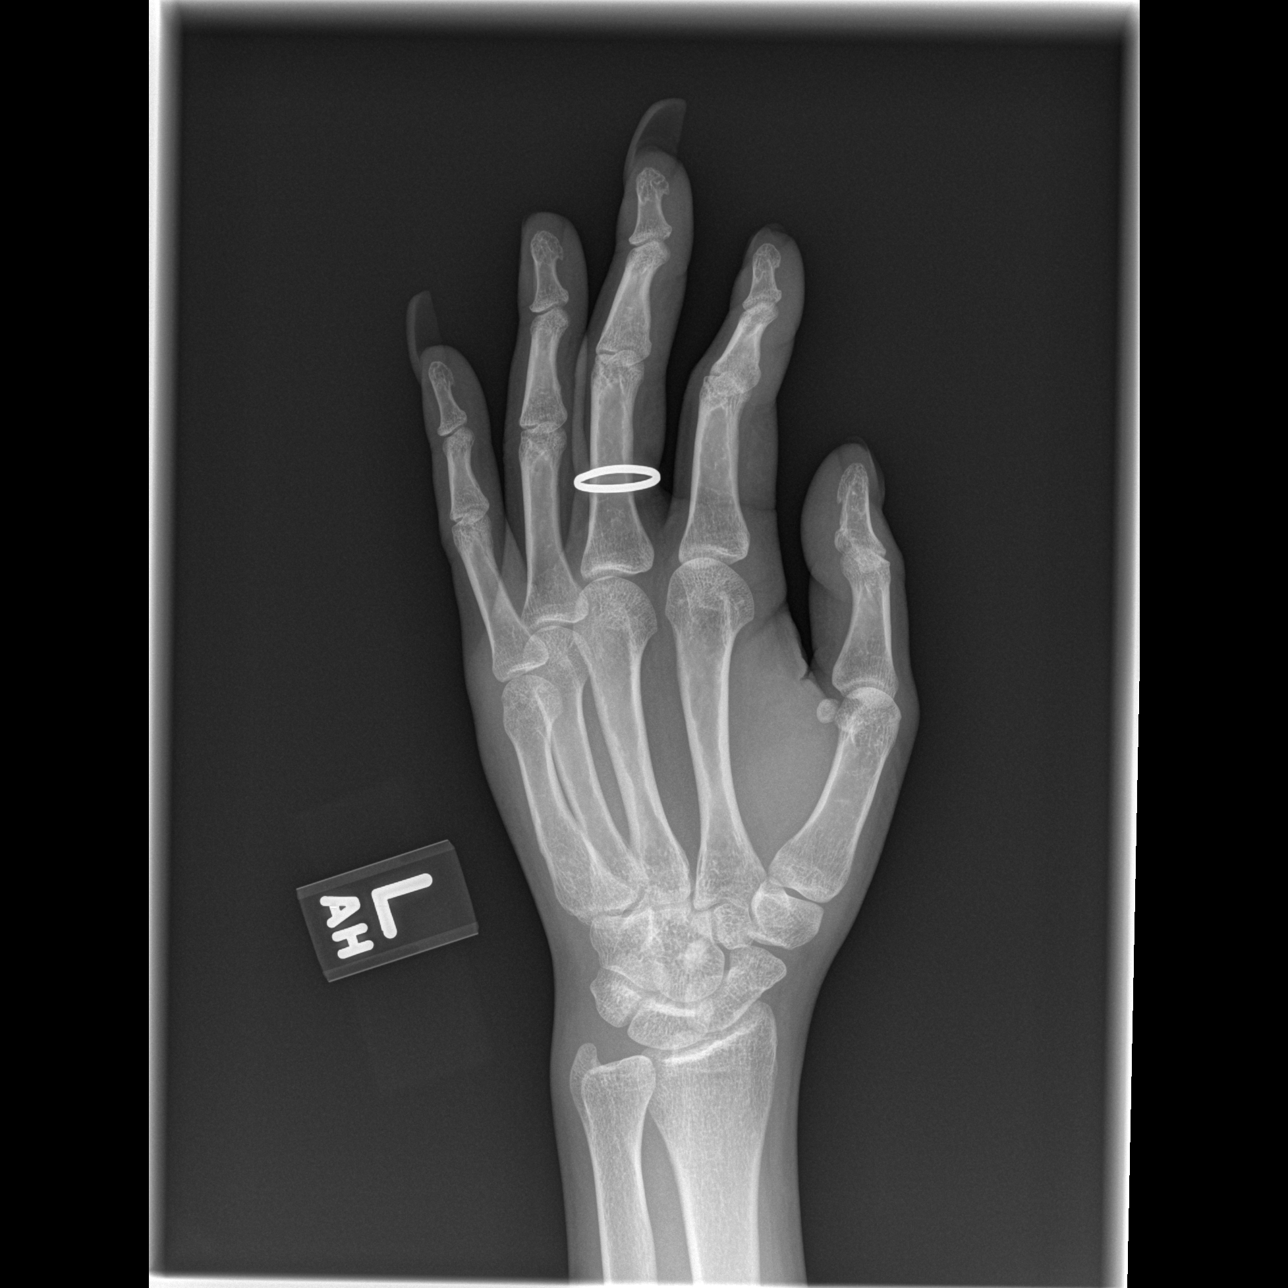

[x hand lat left]
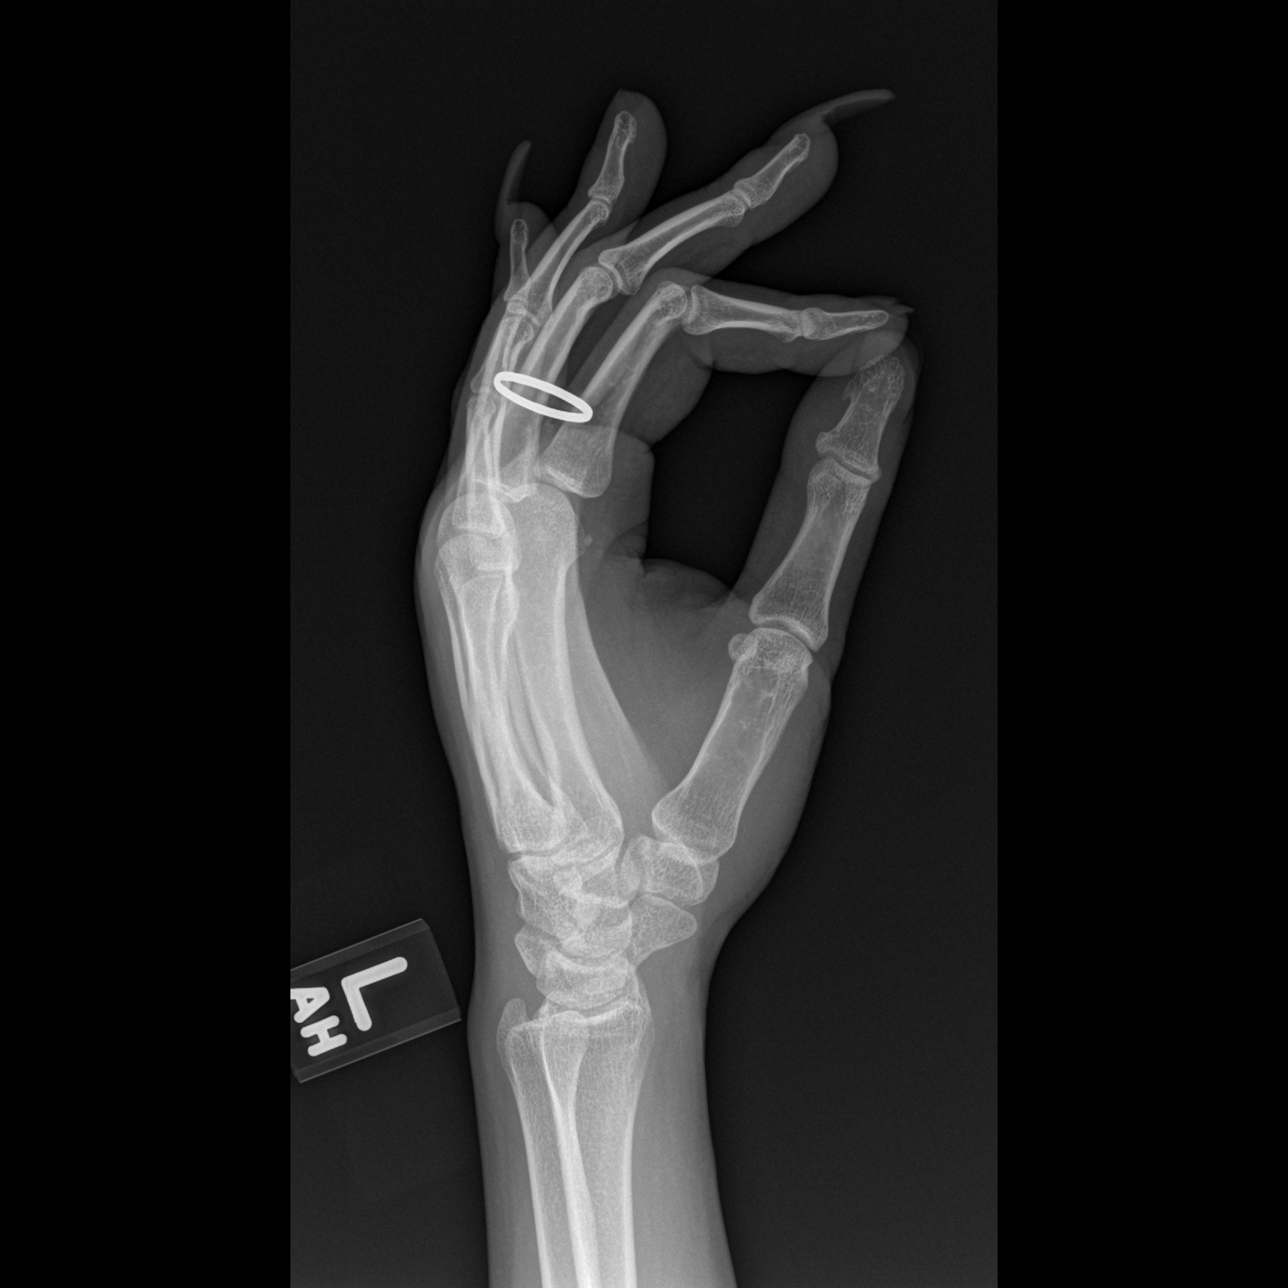

[3 of 3 positions shown; findings below may reference images not displayed]

FINDINGS: There is no evidence of fracture or dislocation. No radiopaque
foreign body.
IMPRESSION: Negative.

## 2017-02-03 ENCOUNTER — Ambulatory Visit: Payer: Self-pay | Admitting: Physician Assistant

## 2018-05-13 ENCOUNTER — Other Ambulatory Visit: Payer: Self-pay

## 2018-05-13 ENCOUNTER — Ambulatory Visit (INDEPENDENT_AMBULATORY_CARE_PROVIDER_SITE_OTHER): Payer: 59 | Admitting: Certified Nurse Midwife

## 2018-05-13 ENCOUNTER — Other Ambulatory Visit (HOSPITAL_COMMUNITY)
Admission: RE | Admit: 2018-05-13 | Discharge: 2018-05-13 | Disposition: A | Discharge status: Home / Self Care | Payer: 59 | Source: Ambulatory Visit | Attending: Obstetrics & Gynecology | Admitting: Obstetrics & Gynecology

## 2018-05-13 ENCOUNTER — Encounter: Payer: Self-pay | Admitting: Certified Nurse Midwife

## 2018-05-13 VITALS — BP 100/64 | HR 68 | Resp 16 | Ht 67.5 in | Wt 146.0 lb

## 2018-05-13 DIAGNOSIS — Z113 Encounter for screening for infections with a predominantly sexual mode of transmission: Secondary | ICD-10-CM

## 2018-05-13 DIAGNOSIS — Z01419 Encounter for gynecological examination (general) (routine) without abnormal findings: Secondary | ICD-10-CM | POA: Diagnosis not present

## 2018-05-13 DIAGNOSIS — Z124 Encounter for screening for malignant neoplasm of cervix: Secondary | ICD-10-CM | POA: Insufficient documentation

## 2018-05-13 NOTE — Progress Notes (Signed)
28 y.o. G1P1001 Single  African American Fe here for annual exam.  Periods normal, monthly, duration 5-6 days with 2-3 day heavy, cramping with OTC use with good results Contraception previous use of Depo Provera, none at present. Condom use at times. Has not used contraception in past month. Not interested in any options today. Desires STD screening due to partner change. No other health issues today.  Patient's last menstrual period was 04/24/2018 (exact date).          Sexually active: Yes.    The current method of family planning is none.    Exercising: Yes.    full body Smoker:  no  Review of Systems  Constitutional: Negative.   HENT: Negative.   Eyes: Negative.   Respiratory: Negative.   Cardiovascular: Negative.   Gastrointestinal: Negative.   Genitourinary: Negative.   Musculoskeletal: Negative.   Skin: Negative.   Neurological: Negative.   Endo/Heme/Allergies: Negative.   Psychiatric/Behavioral: Negative.     Health Maintenance: Pap:  2-37yrs ago neg per patient History of Abnormal Pap: no MMG:  none Self Breast exams: yes Colonoscopy:  none BMD:   none TDaP:  2018 Shingles: no Pneumonia: no Hep C and HIV: neg per patient Labs: yes   reports that she has never smoked. She has never used smokeless tobacco. She reports current alcohol use of about 1.0 standard drinks of alcohol per week. She reports that she does not use drugs.  History reviewed. No pertinent past medical history.  Past Surgical History:  Procedure Laterality Date  . CESAREAN SECTION    . HERNIA REPAIR    . HERNIA REPAIR    . TOE SURGERY      Current Outpatient Medications  Medication Sig Dispense Refill  . Pseudoeph-Doxylamine-DM-APAP (NYQUIL PO) Take by mouth.     No current facility-administered medications for this visit.     Family History  Problem Relation Age of Onset  . Diabetes Father   . Breast cancer Maternal Grandmother     ROS:  Pertinent items are noted in HPI.   Otherwise, a comprehensive ROS was negative.  Exam:   BP 100/64   Pulse 68   Resp 16   Ht 5' 7.5" (1.715 m)   Wt 146 lb (66.2 kg)   LMP 04/24/2018 (Exact Date)   BMI 22.53 kg/m  Height: 5' 7.5" (171.5 cm) Ht Readings from Last 3 Encounters:  05/13/18 5' 7.5" (1.715 m)  02/13/14 5\' 8"  (1.727 m)  03/29/13 5\' 8"  (1.727 m)    General appearance: alert, cooperative and appears stated age Head: Normocephalic, without obvious abnormality, atraumatic Neck: no adenopathy, supple, symmetrical, trachea midline and thyroid normal to inspection and palpation Lungs: clear to auscultation bilaterally Breasts: normal appearance, no masses or tenderness, Inspection negative, No nipple retraction or dimpling, No nipple discharge or bleeding, No axillary or supraclavicular adenopathy Heart: regular rate and rhythm Abdomen: soft, non-tender; no masses,  no organomegaly, c/section scar Extremities: extremities normal, atraumatic, no cyanosis or edema Skin: Skin color, texture, turgor normal. No rashes or lesions, numerous tattoos and scars Lymph nodes: Cervical, supraclavicular, and axillary nodes normal. No abnormal inguinal nodes palpated Neurologic: Grossly normal   Pelvic: External genitalia:  no lesions, normal apperance              Urethra:  normal appearing urethra with no masses, tenderness or lesions              Bartholin's and Skene's: normal  Vagina: normal appearing vagina with normal color and discharge, no lesions              Cervix: slight tenderness with pap smear, normal appearance, no CMT              Pap taken: Yes.   Bimanual Exam:  Uterus:  normal size, contour, position, consistency, mobility, non-tender and anteverted              Adnexa: normal adnexa and no mass, fullness, tenderness               Rectovaginal: Confirms               Anus:  normal normal appearance, no lesions  Chaperone present: yes  A:  Well Woman with normal exam  Contraception  none, condoms occasional  STD screening  P:   Reviewed health and wellness pertinent to exam  Discussed options if interested, declines. Discussed condom use for protection of STD which have increased int the sexually population. Questions addressed.  Lab: STD panel, Hep C, Gc/Chlamydia, Affirm  Pap smear: yes   counseled on breast self exam, STD prevention, HIV risk factors and prevention, feminine hygiene, adequate intake of calcium and vitamin D, diet and exercise  return annually or prn  An After Visit Summary was printed and given to the patient.

## 2018-05-13 NOTE — Patient Instructions (Signed)
General topics  Next pap or exam is  due in 1 year Take a Women's multivitamin Take 1200 mg. of calcium daily - prefer dietary If any concerns in interim to call back  Breast Self-Awareness Practicing breast self-awareness may pick up problems early, prevent significant medical complications, and possibly save your life. By practicing breast self-awareness, you can become familiar with how your breasts look and feel and if your breasts are changing. This allows you to notice changes early. It can also offer you some reassurance that your breast health is good. One way to learn what is normal for your breasts and whether your breasts are changing is to do a breast self-exam. If you find a lump or something that was not present in the past, it is best to contact your caregiver right away. Other findings that should be evaluated by your caregiver include nipple discharge, especially if it is bloody; skin changes or reddening; areas where the skin seems to be pulled in (retracted); or new lumps and bumps. Breast pain is seldom associated with cancer (malignancy), but should also be evaluated by a caregiver. BREAST SELF-EXAM The best time to examine your breasts is 5 7 days after your menstrual period is over.  ExitCare Patient Information 2013 [place].   Exercise to Stay Healthy Exercise helps you become and stay healthy. EXERCISE IDEAS AND TIPS Choose exercises that:  You enjoy.  Fit into your day. You do not need to exercise really hard to be healthy. You can do exercises at a slow or medium level and stay healthy. You can:  Stretch before and after working out.  Try yoga, Pilates, or tai chi.  Lift weights.  Walk fast, swim, jog, run, climb stairs, bicycle, dance, or rollerskate.  Take aerobic classes. Exercises that burn about 150 calories:  Running 1  miles in 15 minutes.  Playing volleyball for 45 to 60 minutes.  Washing and waxing a car for 45 to 60  minutes.  Playing touch football for 45 minutes.  Walking 1  miles in 35 minutes.  Pushing a stroller 1  miles in 30 minutes.  Playing basketball for 30 minutes.  Raking leaves for 30 minutes.  Bicycling 5 miles in 30 minutes.  Walking 2 miles in 30 minutes.  Dancing for 30 minutes.  Shoveling snow for 15 minutes.  Swimming laps for 20 minutes.  Walking up stairs for 15 minutes.  Bicycling 4 miles in 15 minutes.  Gardening for 30 to 45 minutes.  Jumping rope for 15 minutes.  Washing windows or floors for 45 to 60 minutes. Document Released: 03/21/2010 Document Revised: 05/11/2011 Document Reviewed: 03/21/2010 [facility] Patient Information 2013 [place].   Other topics ( that may be useful information):    Sexually Transmitted Disease Sexually transmitted disease (STD) refers to any infection that is passed from person to person during sexual activity. This may happen by way of saliva, semen, blood, vaginal mucus, or urine. Common STDs include:  Gonorrhea.  Chlamydia.  Syphilis.  HIV/AIDS.  Genital herpes.  Hepatitis B and C.  Trichomonas.  Human papillomavirus (HPV).  Pubic lice. CAUSES  An STD may be spread by bacteria, virus, or parasite. A person can get an STD by:  Sexual intercourse with an infected person.  Sharing sex toys with an infected person.  Sharing needles with an infected person.  Having intimate contact with the genitals, mouth, or rectal areas of an infected person. SYMPTOMS  Some people may  they can still pass the infection to others. Different STDs have different symptoms. Symptoms include: °· Painful or bloody urination. °· Pain in the pelvis, abdomen, vagina, anus, throat, or eyes. °· Skin rash, itching, irritation, growths, or sores (lesions). These usually occur in the genital or anal area. °· Abnormal vaginal discharge. °· Penile discharge in men. °· Soft, flesh-colored skin growths in the  genital or anal area. °· Fever. °· Pain or bleeding during sexual intercourse. °· Swollen glands in the groin area. °· Yellow skin and eyes (jaundice). This is seen with hepatitis. °DIAGNOSIS  °To make a diagnosis, your caregiver may: °· Take a medical history. °· Perform a physical exam. °· Take a specimen (culture) to be examined. °· Examine a sample of discharge under a microscope. °· Perform blood test °TREATMENT  °· Chlamydia, gonorrhea, trichomonas, and syphilis can be cured with antibiotic medicine. °· Genital herpes, hepatitis, and HIV can be treated, but not cured, with prescribed medicines. The medicines will lessen the symptoms. °· Genital warts from HPV can be treated with medicine or by freezing, burning (electrocautery), or surgery. Warts may come back. °· HPV is a virus and cannot be cured with medicine or surgery. However, abnormal areas may be followed very closely by your caregiver and may be removed from the cervix, vagina, or vulva through office procedures or surgery. °If your diagnosis is confirmed, your recent sexual partners need treatment. This is true even if they are symptom-free or have a negative culture or evaluation. They should not have sex until their caregiver says it is okay. °HOME CARE INSTRUCTIONS °· All sexual partners should be informed, tested, and treated for all STDs. °· Take your antibiotics as directed. Finish them even if you start to feel better. °· Only take over-the-counter or prescription medicines for pain, discomfort, or fever as directed by your caregiver. °· Rest. °· Eat a balanced diet and drink enough fluids to keep your urine clear or pale yellow. °· Do not have sex until treatment is completed and you have followed up with your caregiver. STDs should be checked after treatment. °· Keep all follow-up appointments, Pap tests, and blood tests as directed by your caregiver. °· Only use latex condoms and water-soluble lubricants during sexual activity. Do not use  petroleum jelly or oils. °· Avoid alcohol and illegal drugs. °· Get vaccinated for HPV and hepatitis. If you have not received these vaccines in the past, talk to your caregiver about whether one or both might be right for you. °· Avoid risky sex practices that can break the skin. °The only way to avoid getting an STD is to avoid all sexual activity. Latex condoms and dental dams (for oral sex) will help lessen the risk of getting an STD, but will not completely eliminate the risk. °SEEK MEDICAL CARE IF:  °· You have a fever. °· You have any new or worsening symptoms. °Document Released: 05/09/2002 Document Revised: 05/11/2011 Document Reviewed: 05/16/2010 °ExitCare® Patient Information ©2013 [employer]. ° ° ° °Domestic Abuse °You are being battered or abused if someone close to you hits, pushes, or physically hurts you in any way. You also are being abused if you are forced into activities. You are being sexually abused if you are forced to have sexual contact of any kind. You are being emotionally abused if you are made to feel worthless or if you are constantly threatened. It is important to remember that help is available. No one has the right to abuse you. °PREVENTION OF FURTHER   abuse you. PREVENTION OF FURTHER ABUSE  Learn the warning signs of danger. This varies with situations but may include: the use of alcohol, threats, isolation from friends and family, or forced sexual contact. Leave if you feel that violence is going to occur.  If you are attacked or beaten, report it to the police so the abuse is documented. You do not have to press charges. The police can protect you while you or the attackers are leaving. Get the officer's name and badge number and a copy of the report.  Find someone you can trust and tell them what is happening to you: your caregiver, a nurse, clergy member, close friend or family member. Feeling ashamed is natural, but remember that you have done nothing wrong. No one deserves abuse. Document Released:  02/14/2000 Document Revised: 05/11/2011 Document Reviewed: 04/24/2010 [facility] Patient Information 2013 [place].    How Much is Too Much Alcohol? Drinking too much alcohol can cause injury, accidents, and health problems. These types of problems can include:   Car crashes.  Falls.  Family fighting (domestic violence).  Drowning.  Fights.  Injuries.  Burns.  Damage to certain organs.  Having a baby with birth defects. ONE DRINK CAN BE TOO MUCH WHEN YOU ARE:  Working.  Pregnant or breastfeeding.  Taking medicines. Ask your doctor.  Driving or planning to drive. If you or someone you know has a drinking problem, get help from a doctor.  Document Released: 12/13/2008 Document Revised: 05/11/2011 Document Reviewed: 12/13/2008 [facility] Patient Information 2013 [place].   Smoking Hazards Smoking cigarettes is extremely bad for your health. Tobacco smoke has over 200 known poisons in it. There are over 60 chemicals in tobacco smoke that cause cancer. Some of the chemicals found in cigarette smoke include:   Cyanide.  Benzene.  Formaldehyde.  Methanol (wood alcohol).  Acetylene (fuel used in welding torches).  Ammonia. Cigarette smoke also contains the poisonous gases nitrogen oxide and carbon monoxide.  Cigarette smokers have an increased risk of many serious medical problems and Smoking causes approximately:  90% of all lung cancer deaths in men.  80% of all lung cancer deaths in women.  90% of deaths from chronic obstructive lung disease. Compared with nonsmokers, smoking increases the risk of:  Coronary heart disease by 2 to 4 times.  Stroke by 2 to 4 times.  Men developing lung cancer by 23 times.  Women developing lung cancer by 13 times.  Dying from chronic obstructive lung diseases by 12 times.  . Smoking is the most preventable cause of death and disease in our society.  WHY IS SMOKING ADDICTIVE?  Nicotine is the chemical  agent in tobacco that is capable of causing addiction or dependence.  When you smoke and inhale, nicotine is absorbed rapidly into the bloodstream through your lungs. Nicotine absorbed through the lungs is capable of creating a powerful addiction. Both inhaled and non-inhaled nicotine may be addictive.  Addiction studies of cigarettes and spit tobacco show that addiction to nicotine occurs mainly during the teen years, when young people begin using tobacco products. WHAT ARE THE BENEFITS OF QUITTING?  There are many health benefits to quitting smoking.   Likelihood of developing cancer and heart disease decreases. Health improvements are seen almost immediately.  Blood pressure, pulse rate, and breathing patterns start returning to normal soon after quitting. QUITTING SMOKING   American Lung Association - 1-800-LUNGUSA  American Cancer Society - 1-800-ACS-2345 Document Released: 03/26/2004 Document Revised: 05/11/2011 Document Reviewed: 11/28/2008  ExitCare Patient Information 2013 [place].   Stress Management Stress is a state of physical or mental tension that often results from changes in your life or normal routine. Some common causes of stress are:  Death of a loved one.  Injuries or severe illnesses.  Getting fired or changing jobs.  Moving into a new home. Other causes may be:  Sexual problems.  Business or financial losses.  Taking on a large debt.  Regular conflict with someone at home or at work.  Constant tiredness from lack of sleep. It is not just bad things that are stressful. It may be stressful to:  Win the lottery.  Get married.  Buy a new car. The amount of stress that can be easily tolerated varies from person to person. Changes generally cause stress, regardless of the types of change. Too much stress can affect your health. It may lead to physical or emotional problems. Too little stress (boredom) may also become stressful. SUGGESTIONS TO  REDUCE STRESS:  Talk things over with your family and friends. It often is helpful to share your concerns and worries. If you feel your problem is serious, you may want to get help from a professional counselor.  Consider your problems one at a time instead of lumping them all together. Trying to take care of everything at once may seem impossible. List all the things you need to do and then start with the most important one. Set a goal to accomplish 2 or 3 things each day. If you expect to do too many in a single day you will naturally fail, causing you to feel even more stressed.  Do not use alcohol or drugs to relieve stress. Although you may feel better for a short time, they do not remove the problems that caused the stress. They can also be habit forming.  Exercise regularly - at least 3 times per week. Physical exercise can help to relieve that "uptight" feeling and will relax you.  The shortest distance between despair and hope is often a good night's sleep.  Go to bed and get up on time allowing yourself time for appointments without being rushed.  Take a short "time-out" period from any stressful situation that occurs during the day. Close your eyes and take some deep breaths. Starting with the muscles in your face, tense them, hold it for a few seconds, then relax. Repeat this with the muscles in your neck, shoulders, hand, stomach, back and legs.  Take good care of yourself. Eat a balanced diet and get plenty of rest.  Schedule time for having fun. Take a break from your daily routine to relax. HOME CARE INSTRUCTIONS   Call if you feel overwhelmed by your problems and feel you can no longer manage them on your own.  Return immediately if you feel like hurting yourself or someone else. Document Released: 08/12/2000 Document Revised: 05/11/2011 Document Reviewed: 04/04/2007 [facility] Patient Information 2013 [place].

## 2018-05-14 LAB — RPR+HBSAG+HIV
HIV Screen 4th Generation wRfx: NONREACTIVE
Hepatitis B Surface Ag: NEGATIVE
RPR Ser Ql: NONREACTIVE

## 2018-05-14 LAB — VAGINITIS/VAGINOSIS, DNA PROBE
CANDIDA SPECIES: NEGATIVE
Gardnerella vaginalis: POSITIVE — AB
Trichomonas vaginosis: NEGATIVE

## 2018-05-14 LAB — HEPATITIS C ANTIBODY

## 2018-05-16 LAB — GC/CHLAMYDIA PROBE AMP
CHLAMYDIA, DNA PROBE: NEGATIVE
Neisseria gonorrhoeae by PCR: NEGATIVE

## 2018-05-17 ENCOUNTER — Telehealth: Payer: Self-pay | Admitting: *Deleted

## 2018-05-17 ENCOUNTER — Ambulatory Visit: Payer: 59 | Admitting: Certified Nurse Midwife

## 2018-05-17 MED ORDER — METRONIDAZOLE 500 MG PO TABS: 500.0000 mg | ORAL_TABLET | Freq: Two times a day (BID) | ORAL | 0 refills | Status: DC

## 2018-05-17 NOTE — Telephone Encounter (Signed)
-----   Message from Verner Chol, CNM sent at 05/16/2018  1:54 PM EDT ----- Notify patient her chlamydia, gonorrhea screen was negative Hep B,C, HIV, RPR were negative Vaginal screen negative for yeast and trichomonas BV positive, not using contraception will need to make sure no concerns for pregnancy before treatment. She may do Flagyl 500 mg bid x 7 or Metrogel one applicator bid x 7 alcohol precautions Pap pending

## 2018-05-17 NOTE — Telephone Encounter (Signed)
If patient says she has not been sexually active since last period, Ok to send Rx

## 2018-05-17 NOTE — Telephone Encounter (Signed)
Rx for flagyl PO to verified pharmacy. Patient notified.   Encounter closed.

## 2018-05-17 NOTE — Telephone Encounter (Signed)
Notes recorded by Leda Min, RN on 05/17/2018 at 9:19 AM EDT Spoke with patient, advised as seen below per Leota Sauers, CNM. LMP 04/24/18. Patient states she has not been SA since last menses. Request Rx for Flagyl PO to verified pharmacy. ETOH precautions reviewed. Patient verbalizes understanding and is agreeable. Rx pended, see telephone encounter dated 05/17/18 for Leota Sauers, CNM to review.

## 2018-05-18 LAB — CYTOLOGY - PAP: DIAGNOSIS: NEGATIVE

## 2018-05-26 ENCOUNTER — Telehealth: Payer: Self-pay | Admitting: Certified Nurse Midwife

## 2018-05-26 NOTE — Telephone Encounter (Signed)
Responded to patient via mychart:   Suzanne Miles,   If you feel like you have yeast symptoms and would like to try home care for now, you can use over the counter treatment like Monistat 3 day or 7 day treatment. It does not have to be brand name. You can purchase this at any pharmacy.  If you would like to be seen for these symptoms we can schedule an appointment for you with one of our doctors on call.  If you use an over the counter treatment and do not have improvement after treatment, we would recommend an office visit.   I will attach instructions on home care for yeast. Please call us if you have any questions or your symptoms worsen.

## 2018-05-26 NOTE — Telephone Encounter (Signed)
Patient sent the following correspondence through MyChart. Routing to triage to assist patient with request.  Good morning, after I completed my antibiotics I think I developed a yeast infection. I have a irritation/itch. Any suggestions?

## 2018-05-26 NOTE — Telephone Encounter (Signed)
Suzanne Miles is a 28 y.o. female  Flagyl 500 mg BID x 7 days started on 05/17/2018. Neg yeast 13 days ago.   Okay to close?

## 2018-09-30 ENCOUNTER — Ambulatory Visit (INDEPENDENT_AMBULATORY_CARE_PROVIDER_SITE_OTHER)
Admission: RE | Admit: 2018-09-30 | Discharge: 2018-09-30 | Disposition: A | Discharge status: Home / Self Care | Payer: 59 | Source: Ambulatory Visit

## 2018-09-30 DIAGNOSIS — N898 Other specified noninflammatory disorders of vagina: Secondary | ICD-10-CM | POA: Diagnosis not present

## 2018-09-30 MED ORDER — METRONIDAZOLE 500 MG PO TABS: 500.0000 mg | ORAL_TABLET | Freq: Two times a day (BID) | ORAL | 0 refills | Status: DC

## 2018-09-30 MED ORDER — FLUCONAZOLE 150 MG PO TABS: 150.0000 mg | ORAL_TABLET | Freq: Every day | ORAL | 0 refills | Status: DC

## 2018-09-30 NOTE — Discharge Instructions (Addendum)
Take the medication as prescribed °Follow up as needed for continued or worsening symptoms ° °

## 2018-09-30 NOTE — ED Provider Notes (Signed)
Virtual Visit via Video Note:  Suzanne Miles  initiated request for Telemedicine visit with [facility] team. I connected with [name]  on 09/30/2018 at 9:42 AM  for a synchronized telemedicine visit using a video enabled HIPPA compliant telemedicine application. I verified that I am speaking with [name]  using two identifiers. Orvan July, NP  was physically located in a [facility]gent care site and Nashika Coker was located at a different location.   The limitations of evaluation and management by telemedicine as well as the availability of in-person appointments were discussed. Patient was informed that she  may incur a bill ( including co-pay) for this virtual visit encounter. Katrin Grabel  expressed understanding and gave verbal consent to proceed with virtual visit.     History of Present Illness:[name]  is a 28 y.o. female presents with possible bacterial vaginosis.  Reporting that 3 months ago she had similar symptoms and tested positive for BV.  She was treated at that time.  She has had approximately 1 week of discharge with odor.  Denies any itching, irritation in the vaginal area.  She is currently on her menstrual cycle.  This started this past Sunday.  Denies any associated abdominal pain, back pain, fevers, dysuria, hematuria or urinary frequency.   History reviewed. No pertinent past medical history.  No Known Allergies      Observations/Objective:VITALS: Per patient if applicable, see vitals. GENERAL: Alert, appears well and in no acute distress. HEENT: Atraumatic, conjunctiva clear, no obvious abnormalities on inspection of external nose and ears. NECK: Normal movements of the head and neck. CARDIOPULMONARY: No increased WOB. Speaking in clear sentences. I:E ratio WNL.  MS: Moves all visible extremities without noticeable abnormality. PSYCH: Pleasant and cooperative, well-groomed. Speech normal rate and rhythm. Affect is appropriate.  Insight and judgement are appropriate. Attention is focused, linear, and appropriate.  NEURO: CN grossly intact. Oriented as arrived to appointment on time with no prompting. Moves both UE equally.  SKIN: No obvious lesions, wounds, erythema, or cyanosis noted on face or hands.     Assessment and Plan:Treating for bacterial vaginosis with Flagyl    Follow Up Instructions: Recommended follow-up in person for continued or worsening problems.   I discussed the assessment and treatment plan with the patient. The patient was provided an opportunity to ask questions and all were answered. The patient agreed with the plan and demonstrated an understanding of the instructions.   The patient was advised to call back or seek an in-person evaluation if the symptoms worsen or if the condition fails to improve as anticipated.      Orvan July, NP  09/30/2018 9:42 AM          Orvan July, NP 09/30/18 [id number]

## 2018-11-01 ENCOUNTER — Other Ambulatory Visit: Payer: Self-pay

## 2018-11-01 ENCOUNTER — Encounter: Payer: Self-pay | Admitting: Certified Nurse Midwife

## 2018-11-01 ENCOUNTER — Ambulatory Visit (INDEPENDENT_AMBULATORY_CARE_PROVIDER_SITE_OTHER): Payer: 59 | Admitting: Certified Nurse Midwife

## 2018-11-01 VITALS — BP 102/74 | HR 68 | Temp 97.2°F | Resp 16 | Wt 152.0 lb

## 2018-11-01 DIAGNOSIS — N898 Other specified noninflammatory disorders of vagina: Secondary | ICD-10-CM | POA: Diagnosis not present

## 2018-11-01 DIAGNOSIS — Z113 Encounter for screening for infections with a predominantly sexual mode of transmission: Secondary | ICD-10-CM

## 2018-11-01 DIAGNOSIS — N926 Irregular menstruation, unspecified: Secondary | ICD-10-CM

## 2018-11-01 LAB — POCT URINE PREGNANCY: Preg Test, Ur: NEGATIVE

## 2018-11-01 NOTE — Progress Notes (Signed)
28 y.o. Single African American female G1P1001 here with complaint of vaginal symptoms of itching, and increase discharge with redness and irritated all the way to rectum area at times. Describes discharge as white thick, no odor, occasional pink. Period was different this month with mainly light period with spotting off and on. She has had this before. UPT here today negative.. Onset of symptoms one month ago. Denies new personal products. Does have burning with urination  touches irritated skin only. Desires STD screening of GC/Chlamydia only.. Urinary symptoms none . Contraception is condoms, mostly consistent. "Tired of having discharge. Had Diflucan given earlier in the year with some relief. No other health issues today.  Review of Systems  Constitutional: Negative.   HENT: Negative.   Eyes: Negative.   Respiratory: Negative.   Cardiovascular: Negative.   Gastrointestinal: Negative.   Genitourinary: Negative.        Vaginal discharge that continues to cause itching  Musculoskeletal: Negative.   Skin: Positive for itching.       Vaginal area  Neurological: Negative.   Endo/Heme/Allergies: Negative.   Psychiatric/Behavioral: Negative.     O:Healthy female WDWN Affect: normal, orientation x 3  Exam:Skin: warm and dry Abdomen:soft, non tender  Inguinal Lymph nodes: no enlargement or tenderness Pelvic exam: External genital: normal female with slight edema noted on vulva and slight increase pink in skin, no scaling or lesions BUS: negative Vagina: white/red non odorous discharge noted.  Affirm taken, GC/Chlamydia taken Cervix: normal, non tender, no CMT Uterus: normal, non tender Adnexa:normal, non tender, no masses or fullness noted upt-neg  A:Normal pelvic exam Change in period, but duration same R/O vaginal infection   P:Discussed findings of discharge and blood noted in vagina. She is finishing period appearance.  Discussed Aveeno  sitz bath for comfort. Avoid moist  clothes or pads for extended period of time. If working out in gym clothes or swim suits for long periods of time change underwear or bottoms of swimsuit if possible. Will treat once labs in. Questions addressed. Lab: Affirm, GC/chlamydia  Rv prn

## 2018-11-01 NOTE — Patient Instructions (Signed)
Vaginal Yeast infection, Adult  Vaginal yeast infection is a condition that causes vaginal discharge as well as soreness, swelling, and redness (inflammation) of the vagina. This is a common condition. Some women get this infection frequently. What are the causes? This condition is caused by a change in the normal balance of the yeast (candida) and bacteria that live in the vagina. This change causes an overgrowth of yeast, which causes the inflammation. What increases the risk? The condition is more likely to develop in women who:  Take antibiotic medicines.  Have diabetes.  Take birth control pills.  Are pregnant.  Douche often.  Have a weak body defense system (immune system).  Have been taking steroid medicines for a long time.  Frequently wear tight clothing. What are the signs or symptoms? Symptoms of this condition include:  White, thick, creamy vaginal discharge.  Swelling, itching, redness, and irritation of the vagina. The lips of the vagina (vulva) may be affected as well.  Pain or a burning feeling while urinating.  Pain during sex. How is this diagnosed? This condition is diagnosed based on:  Your medical history.  A physical exam.  A pelvic exam. Your health care provider will examine a sample of your vaginal discharge under a microscope. Your health care provider may send this sample for testing to confirm the diagnosis. How is this treated? This condition is treated with medicine. Medicines may be over-the-counter or prescription. You may be told to use one or more of the following:  Medicine that is taken by mouth (orally).  Medicine that is applied as a cream (topically).  Medicine that is inserted directly into the vagina (suppository). Follow these instructions at home:  Lifestyle  Do not have sex until your health care provider approves. Tell your sex partner that you have a yeast infection. That person should go to his or her health care  provider and ask if they should also be treated.  Do not wear tight clothes, such as pantyhose or tight pants.  Wear breathable cotton underwear. General instructions  Take or apply over-the-counter and prescription medicines only as told by your health care provider.  Eat more yogurt. This may help to keep your yeast infection from returning.  Do not use tampons until your health care provider approves.  Try taking a sitz bath to help with discomfort. This is a warm water bath that is taken while you are sitting down. The water should only come up to your hips and should cover your buttocks. Do this 3-4 times per day or as told by your health care provider.  Do not douche.  If you have diabetes, keep your blood sugar levels under control.  Keep all follow-up visits as told by your health care provider. This is important. Contact a health care provider if:  You have a fever.  Your symptoms go away and then return.  Your symptoms do not get better with treatment.  Your symptoms get worse.  You have new symptoms.  You develop blisters in or around your vagina.  You have blood coming from your vagina and it is not your menstrual period.  You develop pain in your abdomen. Summary  Vaginal yeast infection is a condition that causes discharge as well as soreness, swelling, and redness (inflammation) of the vagina.  This condition is treated with medicine. Medicines may be over-the-counter or prescription.  Take or apply over-the-counter and prescription medicines only as told by your health care provider.  Do not douche.   Do not have sex or use tampons until your health care provider approves.  Contact a health care provider if your symptoms do not get better with treatment or your symptoms go away and then return. This information is not intended to replace advice given to you by your health care provider. Make sure you discuss any questions you have with your health care  provider. Document Released: 11/26/2004 Document Revised: 07/05/2017 Document Reviewed: 07/05/2017 Elsevier Patient Education  2020 [employer]  

## 2018-11-02 LAB — VAGINITIS/VAGINOSIS, DNA PROBE
Candida Species: NEGATIVE
Gardnerella vaginalis: NEGATIVE
Trichomonas vaginosis: NEGATIVE

## 2018-11-02 LAB — GC/CHLAMYDIA PROBE AMP
Chlamydia trachomatis, NAA: NEGATIVE
Neisseria Gonorrhoeae by PCR: NEGATIVE

## 2018-11-03 ENCOUNTER — Telehealth: Payer: Self-pay

## 2018-11-03 NOTE — Telephone Encounter (Signed)
Opened in error

## 2018-11-16 ENCOUNTER — Telehealth: Payer: Self-pay | Admitting: Certified Nurse Midwife

## 2018-11-16 NOTE — Telephone Encounter (Signed)
Patient sent the following correspondence through [facility].    I have a question about VAGINITIS/VAGINOSIS, DNA PROBE resulted on 11/02/18, 8:36 AM.  I am having thick white discharge that appears in clumps. I have no smell or odor and no itch. But my discharge is very thick white and clumpy. I'm not sure what it is but it's not normal. I'm not sure what to do. I haven't had sex or anything.

## 2018-11-16 NOTE — Telephone Encounter (Signed)
Call to patient. LMP 10-25-2018. Abstinence for contraception. Patient states her vaginitis testing was negative earlier this month, but she continues to have a "white and clumpy" discharge. Denies odor, burning, itching, pain or fever. States she hasn't had intercourse in over a month. No recent change in personal products. OV recommended. Patient advised Debbi out of office tomorrow, agreeable to schedule with another provider. OV scheduled for 11-17-2018 at 0900 with Dr. Talbert Nan. Patient agreeable to date and time of appointment. Covid prescreening negative.   Routing to provider and will close encounter.   Cc Dr. Talbert Nan

## 2018-11-17 ENCOUNTER — Ambulatory Visit: Payer: 59 | Admitting: Obstetrics and Gynecology

## 2018-11-17 NOTE — Progress Notes (Deleted)
GYNECOLOGY  VISIT   HPI: 28 y.o.   Single Black or African American Not Hispanic or Latino  female   [id number] with Patient's last menstrual period was 10/25/2018 (exact date).   here for vaginal discharge     GYNECOLOGIC HISTORY: Patient's last menstrual period was 10/25/2018 (exact date). Contraception:***none Menopausal hormone therapy: n/a        OB History    Gravida  1   Para  1   Term  1   Preterm      AB      Living  1     SAB      TAB      Ectopic      Multiple      Live Births                 There are no active problems to display for this patient.   No past medical history on file.  Past Surgical History:  Procedure Laterality Date  . CESAREAN SECTION    . HERNIA REPAIR     inguinal  . TOE SURGERY      No current outpatient medications on file.   No current facility-administered medications for this visit.      ALLERGIES: Patient has no known allergies.  Family History  Problem Relation Age of Onset  . Diabetes Father   . Breast cancer Maternal Grandmother 68       still allive    Social History   Socioeconomic History  . Marital status: Single    Spouse name: Not on file  . Number of children: Not on file  . Years of education: Not on file  . Highest education level: Not on file  Occupational History  . Not on file  Social Needs  . Financial resource strain: Not on file  . Food insecurity    Worry: Not on file    Inability: Not on file  . Transportation needs    Medical: Not on file    Non-medical: Not on file  Tobacco Use  . Smoking status: Never Smoker  . Smokeless tobacco: Never Used  Substance and Sexual Activity  . Alcohol use: Yes    Alcohol/week: 1.0 standard drinks    Types: 1 Standard drinks or equivalent per week    Comment: occasional  . Drug use: No  . Sexual activity: Yes    Partners: Male    Birth control/protection: None  Lifestyle  . Physical activity    Days per week: Not on file   Minutes per session: Not on file  . Stress: Not on file  Relationships  . Social Herbalist on phone: Not on file    Gets together: Not on file    Attends religious service: Not on file    Active member of club or organization: Not on file    Attends meetings of clubs or organizations: Not on file    Relationship status: Not on file  . Intimate partner violence    Fear of current or ex partner: Not on file    Emotionally abused: Not on file    Physically abused: Not on file    Forced sexual activity: Not on file  Other Topics Concern  . Not on file  Social History Narrative  . Not on file    ROS  PHYSICAL EXAMINATION:    LMP 10/25/2018 (Exact Date)     General appearance: alert, cooperative and appears stated age  Neck: no adenopathy, supple, symmetrical, trachea midline and thyroid {CHL AMB PHY EX THYROID NORM DEFAULT:[phone]::"normal to inspection and palpation"} Breasts: {Exam; breast:13139::"normal appearance, no masses or tenderness"} Abdomen: soft, non-tender; non distended, no masses,  no organomegaly  Pelvic: External genitalia:  no lesions              Urethra:  normal appearing urethra with no masses, tenderness or lesions              Bartholins and Skenes: normal                 Vagina: normal appearing vagina with normal color and discharge, no lesions              Cervix: {CHL AMB PHY EX CERVIX NORM DEFAULT:[phone]::"no lesions"}              Bimanual Exam:  Uterus:  {CHL AMB PHY EX UTERUS NORM [phone]::"normal size, contour, position, consistency, mobility, non-tender"}              Adnexa: {CHL AMB PHY EX ADNEXA NO MASS [phone]::"no mass, fullness, tenderness"}              Rectovaginal: {yes no:314532}.  Confirms.              Anus:  normal sphincter tone, no lesions  Chaperone was present for exam.  ASSESSMENT     PLAN    An After Visit Summary was printed and given to the patient.  *** minutes face to face  time of which over 50% was spent in counseling.

## 2018-12-15 ENCOUNTER — Other Ambulatory Visit: Payer: Self-pay

## 2018-12-15 ENCOUNTER — Encounter: Payer: Self-pay | Admitting: Obstetrics and Gynecology

## 2018-12-15 ENCOUNTER — Ambulatory Visit (INDEPENDENT_AMBULATORY_CARE_PROVIDER_SITE_OTHER): Payer: 59 | Admitting: Obstetrics and Gynecology

## 2018-12-15 ENCOUNTER — Telehealth: Payer: Self-pay | Admitting: Certified Nurse Midwife

## 2018-12-15 VITALS — BP 112/76 | HR 64 | Temp 97.1°F | Wt 152.0 lb

## 2018-12-15 DIAGNOSIS — N3090 Cystitis, unspecified without hematuria: Secondary | ICD-10-CM | POA: Diagnosis not present

## 2018-12-15 DIAGNOSIS — R350 Frequency of micturition: Secondary | ICD-10-CM | POA: Diagnosis not present

## 2018-12-15 LAB — POCT URINALYSIS DIPSTICK
Bilirubin, UA: NEGATIVE
Blood, UA: POSITIVE
Glucose, UA: NEGATIVE
Ketones, UA: NEGATIVE
Nitrite, UA: NEGATIVE
Protein, UA: NEGATIVE
Spec Grav, UA: 1.01 (ref 1.010–1.025)
Urobilinogen, UA: 0.2 E.U./dL
pH, UA: 5 (ref 5.0–8.0)

## 2018-12-15 MED ORDER — PHENAZOPYRIDINE HCL 200 MG PO TABS: 200.0000 mg | ORAL_TABLET | Freq: Three times a day (TID) | ORAL | 0 refills | Status: DC | PRN

## 2018-12-15 MED ORDER — SULFAMETHOXAZOLE-TRIMETHOPRIM 800-160 MG PO TABS: 1.0000 | ORAL_TABLET | Freq: Two times a day (BID) | ORAL | 0 refills | Status: DC

## 2018-12-15 NOTE — Telephone Encounter (Signed)
Spoke with patient. Patient reports dysuria and not completing emptying after voiding. Symptoms started 4 days ago. Has tried increasing water intake, no relief. Denies flank pain, blood in urine, vaginal d/c or odor. Advised OV needed for further evaluation. Covid 19 prescreen negative, precautions reviewed. Patient aware Melvia Heaps, CNM out of the office today, agreeable to schedule with any provider. OV scheduled for today at 1:30pm with Dr. Talbert Nan.   Routing to provider for final review. Patient is agreeable to disposition. Will close encounter.  Cc: Melvia Heaps, CNM

## 2018-12-15 NOTE — Patient Instructions (Signed)

## 2018-12-15 NOTE — Progress Notes (Signed)
GYNECOLOGY  VISIT   HPI: 28 y.o.   Single Black or African American Not Hispanic or Latino  female   [id number] with Patient's last menstrual period was 11/27/2018 (approximate).   here for urinary frequency that began 3 days ago. Some discomfort in her bladder at the end of voiding. + urgency. Denies back pain, fever, or chills.   No change in sexual partner.  GYNECOLOGIC HISTORY: Patient's last menstrual period was 11/27/2018 (approximate). Contraception: Condoms Menopausal hormone therapy: None        OB History    Gravida  1   Para  1   Term  1   Preterm      AB      Living  1     SAB      TAB      Ectopic      Multiple      Live Births                 There are no active problems to display for this patient.   History reviewed. No pertinent past medical history.  Past Surgical History:  Procedure Laterality Date  . CESAREAN SECTION    . HERNIA REPAIR     inguinal  . TOE SURGERY      No current outpatient medications on file.   No current facility-administered medications for this visit.      ALLERGIES: Patient has no known allergies.  Family History  Problem Relation Age of Onset  . Diabetes Father   . Breast cancer Maternal Grandmother 22       still allive    Social History   Socioeconomic History  . Marital status: Single    Spouse name: Not on file  . Number of children: Not on file  . Years of education: Not on file  . Highest education level: Not on file  Occupational History  . Not on file  Social Needs  . Financial resource strain: Not on file  . Food insecurity    Worry: Not on file    Inability: Not on file  . Transportation needs    Medical: Not on file    Non-medical: Not on file  Tobacco Use  . Smoking status: Never Smoker  . Smokeless tobacco: Never Used  Substance and Sexual Activity  . Alcohol use: Yes    Alcohol/week: 1.0 standard drinks    Types: 1 Standard drinks or equivalent per week    Comment:  occasional  . Drug use: No  . Sexual activity: Yes    Partners: Male    Birth control/protection: Condom  Lifestyle  . Physical activity    Days per week: Not on file    Minutes per session: Not on file  . Stress: Not on file  Relationships  . Social Herbalist on phone: Not on file    Gets together: Not on file    Attends religious service: Not on file    Active member of club or organization: Not on file    Attends meetings of clubs or organizations: Not on file    Relationship status: Not on file  . Intimate partner violence    Fear of current or ex partner: Not on file    Emotionally abused: Not on file    Physically abused: Not on file    Forced sexual activity: Not on file  Other Topics Concern  . Not on file  Social History Narrative  .  Not on file    Review of Systems  Constitutional: Negative.   HENT: Negative.   Eyes: Negative.   Respiratory: Negative.   Cardiovascular: Negative.   Gastrointestinal: Negative.   Genitourinary: Positive for frequency.  Musculoskeletal: Negative.   Skin: Negative.   Neurological: Negative.   Endo/Heme/Allergies: Negative.   Psychiatric/Behavioral: Negative.     PHYSICAL EXAMINATION:    BP 112/76 (BP Location: Right Arm, Patient Position: Sitting, Cuff Size: Normal)   Pulse 64   Temp (!) 97.1 F (36.2 C) (Skin)   Wt 152 lb (68.9 kg)   LMP 11/27/2018 (Approximate)   BMI 23.46 kg/m     General appearance: alert, cooperative and appears stated age Abdomen: soft, mildly tender in the suprapubic region; non distended, no masses,  no organomegaly CVA: not tender.   ASSESSMENT Cystitis    PLAN Send urine for ua, c&s Treat with bactrim ds and pyridium Call in 48 hours if not improved   An After Visit Summary was printed and given to the patient.

## 2018-12-15 NOTE — Telephone Encounter (Signed)
Patient sent the following correspondence through [facility].  Hello, it seems that I have a UTI. I tried drinking lots of water but it's been 3 days and it hasn't gotten better

## 2018-12-16 LAB — URINALYSIS, MICROSCOPIC ONLY
Casts: NONE SEEN /lpf
Epithelial Cells (non renal): >10 /hpf — AB (ref 0–10)
WBC, UA: >30 /hpf — AB (ref 0–5)

## 2018-12-17 LAB — URINE CULTURE

## 2018-12-19 ENCOUNTER — Other Ambulatory Visit: Payer: Self-pay

## 2018-12-19 MED ORDER — AMOXICILLIN-POT CLAVULANATE 500-125 MG PO TABS: 1.0000 | ORAL_TABLET | Freq: Two times a day (BID) | ORAL | 0 refills | Status: DC

## 2019-01-25 ENCOUNTER — Telehealth: Payer: Self-pay | Admitting: Certified Nurse Midwife

## 2019-01-25 ENCOUNTER — Ambulatory Visit: Payer: 59 | Admitting: Certified Nurse Midwife

## 2019-01-25 ENCOUNTER — Encounter: Payer: Self-pay | Admitting: Certified Nurse Midwife

## 2019-01-25 NOTE — Telephone Encounter (Signed)
Patient [facility] today's appointment. Rescheduled to 01/30/19. OK to send letter?

## 2019-01-25 NOTE — Telephone Encounter (Signed)
Yes

## 2019-01-30 ENCOUNTER — Encounter: Payer: Self-pay | Admitting: Certified Nurse Midwife

## 2019-01-30 ENCOUNTER — Telehealth: Payer: Self-pay | Admitting: Certified Nurse Midwife

## 2019-01-30 ENCOUNTER — Ambulatory Visit: Payer: 59 | Admitting: Certified Nurse Midwife

## 2019-01-30 NOTE — Telephone Encounter (Signed)
Patient Suzanne Miles today's appointment. Called patient to reschedule, voicemail full.

## 2019-01-30 NOTE — Progress Notes (Deleted)
   Subjective:   28 y.o. Single{Race/ethnicity:17218} female presents for evaluation of {Right/left:16020} breast mass. Onset of the symptoms was{TIME UNITS:19995}. Patient sought evaluation because of {Symptoms; ROS skin/breast:30521}.  Contributing factors include {Causes; risk factors breast ca:12807}. Denies {Constitutional Sx:60209}. Patient denies hiistory of trauma, bites, or injuries. Last mammogram was {Time; 1 month to 1 year:14528}.  Previous evaluation has included{Procedures; breast eval:31381}   Review of Systems {Ros - Complete:30496}@SUBJECTIVE    Objective:   @General  appearance: {general exam:16600} Head: {head exam:30909::"Normocephalic, without obvious abnormality","atraumatic"} Neck: {neck exam:17463::"no adenopathy","no carotid bruit","no JVD","supple, symmetrical, trachea midline","thyroid not enlarged, symmetric, no tenderness/mass/nodules"} Back: {back exam:801::"symmetric, no curvature. ROM normal. No CVA tenderness."} Lungs: {lung exam:16931} Breasts: {breast exam:13139::"normal appearance, no masses or tenderness"} Heart: {heart exam:5510} Abdomen: {abdominal exam:16834}    Assessment:   @ASSESSMENT :Patient is diagnosed with {DIAGNOSES; IRJJOA:41660}   Plan:   @PLAN : The patient {has/does not have:19846} a documented plan to follow with further care of {Diagnostic/therapeutic plan md:30626} on {DATE MONTH DAY YTKZ:[id number]} 2. PLAN: FOLLOW {plan; follow-up ent:15048}

## 2019-02-02 ENCOUNTER — Other Ambulatory Visit: Payer: Self-pay

## 2019-02-02 DIAGNOSIS — Z20822 Contact with and (suspected) exposure to covid-19: Secondary | ICD-10-CM

## 2019-02-06 LAB — NOVEL CORONAVIRUS, NAA: SARS-CoV-2, NAA: NOT DETECTED

## 2019-05-15 ENCOUNTER — Encounter: Payer: Self-pay | Admitting: Certified Nurse Midwife

## 2019-05-17 ENCOUNTER — Encounter: Payer: Self-pay | Admitting: Certified Nurse Midwife

## 2019-05-18 ENCOUNTER — Ambulatory Visit: Payer: 59 | Admitting: Certified Nurse Midwife

## 2019-10-26 ENCOUNTER — Ambulatory Visit: Payer: Self-pay

## 2019-12-05 ENCOUNTER — Ambulatory Visit: Payer: Self-pay | Admitting: Obstetrics and Gynecology

## 2019-12-05 ENCOUNTER — Telehealth: Payer: Self-pay

## 2019-12-05 ENCOUNTER — Other Ambulatory Visit: Payer: Self-pay

## 2019-12-05 NOTE — Telephone Encounter (Signed)
Patient was late and not seen for appointment today. Patient would like to reschedule.

## 2019-12-05 NOTE — Telephone Encounter (Signed)
Spoke with pt. Pt needing to reschedule appt due to not being on time to appt today. Pt states was late due to going to wrong building. Pt to bring new insurance card. Pt verbalized understanding.  Pt scheduled with Dr Edward Jolly on 10/7 at 4 pm. Pt agreeable to date and time of appt.  Encounter closed.

## 2019-12-05 NOTE — Telephone Encounter (Signed)
AEX 05/2018 with DL, not scheduled for 1497. Last OV 12/2018 with JJ for cystitis LMP 11/04/19, no contraception  G1P1  Spoke with pt. Pt states having missed menses x 3 days. States has taken 3 home pregnancy tests and all are negative. Pt states having cramps and breast soreness/tenderness with some mild nausea. Denies fever, chills, vomiting, diarrhea, severe abd pain or any vaginal sx or UTI sx at this time.  Pt states usually has cycles like "clock work". Pt states had SA on 9/16 that was unprotected.   Advised pt to have OV for further evaluation. Pt agreeable. Pt scheduled today with Dr Oscar La at 330 pm. Pt agreeable to date and time. Pt thankful for OV.  Encounter closed

## 2019-12-05 NOTE — Telephone Encounter (Signed)
Patient would like to schedule appointment for pregnancy test.

## 2019-12-07 ENCOUNTER — Telehealth: Payer: Self-pay

## 2019-12-07 ENCOUNTER — Ambulatory Visit: Payer: No Typology Code available for payment source | Admitting: Obstetrics and Gynecology

## 2019-12-07 NOTE — Telephone Encounter (Signed)
Patient cancelled appointment for pregnancy confirmation. She said it is not needed.

## 2020-07-19 ENCOUNTER — Telehealth: Payer: No Typology Code available for payment source

## 2020-09-04 ENCOUNTER — Ambulatory Visit: Payer: No Typology Code available for payment source | Admitting: Obstetrics and Gynecology

## 2020-09-04 NOTE — Progress Notes (Deleted)
GYNECOLOGY  VISIT   HPI: 30 y.o.   Single Black or African American Not Hispanic or Latino  female   G1P1001 with No LMP recorded.   here for  Std testing   GYNECOLOGIC HISTORY: No LMP recorded. Contraception:*** Menopausal hormone therapy: NA        OB History     Gravida  1   Para  1   Term  1   Preterm      AB      Living  1      SAB      IAB      Ectopic      Multiple      Live Births                 There are no problems to display for this patient.   No past medical history on file.  Past Surgical History:  Procedure Laterality Date   CESAREAN SECTION     HERNIA REPAIR     inguinal   TOE SURGERY      Current Outpatient Medications  Medication Sig Dispense Refill   amoxicillin-clavulanate (AUGMENTIN) 500-125 MG tablet Take 1 tablet (500 mg total) by mouth 2 (two) times daily. 10 tablet 0   phenazopyridine (PYRIDIUM) 200 MG tablet Take 1 tablet (200 mg total) by mouth 3 (three) times daily as needed. 6 tablet 0   sulfamethoxazole-trimethoprim (BACTRIM DS) 800-160 MG tablet Take 1 tablet by mouth 2 (two) times daily. One PO BID x 3 days 6 tablet 0   No current facility-administered medications for this visit.     ALLERGIES: Patient has no known allergies.  Family History  Problem Relation Age of Onset   Diabetes Father    Breast cancer Maternal Grandmother 65       still allive    Social History   Socioeconomic History   Marital status: Single    Spouse name: Not on file   Number of children: Not on file   Years of education: Not on file   Highest education level: Not on file  Occupational History   Not on file  Tobacco Use   Smoking status: Never   Smokeless tobacco: Never  Substance and Sexual Activity   Alcohol use: Yes    Alcohol/week: 1.0 standard drink    Types: 1 Standard drinks or equivalent per week    Comment: occasional   Drug use: No   Sexual activity: Yes    Partners: Male    Birth control/protection:  Condom  Other Topics Concern   Not on file  Social History Narrative   Not on file   Social Determinants of Health   Financial Resource Strain: Not on file  Food Insecurity: Not on file  Transportation Needs: Not on file  Physical Activity: Not on file  Stress: Not on file  Social Connections: Not on file  Intimate Partner Violence: Not on file    ROS  PHYSICAL EXAMINATION:    There were no vitals taken for this visit.    General appearance: alert, cooperative and appears stated age Neck: no adenopathy, supple, symmetrical, trachea midline and thyroid {CHL AMB PHY EX THYROID NORM [phone]} Breasts: {Exam; breast:13139} Abdomen: soft, non-tender; non distended, no masses,  no organomegaly  Pelvic: External genitalia:  no lesions              Urethra:  normal appearing urethra with no masses, tenderness or lesions  Bartholins and Skenes: normal                 Vagina: normal appearing vagina with normal color and discharge, no lesions              Cervix: {CHL AMB PHY EX CERVIX NORM [phone]}              Bimanual Exam:  Uterus:  {CHL AMB PHY EX UTERUS NORM DEFAULT:[phone]}              Adnexa: {CHL AMB PHY EX ADNEXA NO MASS DEFAULT:[phone]}              Rectovaginal: {yes no:314532}.  Confirms.              Anus:  normal sphincter tone, no lesions  Chaperone was present for exam.  ASSESSMENT     PLAN    An After Visit Summary was printed and given to the patient.  *** minutes face to face time of which over 50% was spent in counseling.

## 2021-04-02 ENCOUNTER — Telehealth: Payer: No Typology Code available for payment source | Admitting: Physician Assistant

## 2021-04-02 DIAGNOSIS — J208 Acute bronchitis due to other specified organisms: Secondary | ICD-10-CM | POA: Diagnosis not present

## 2021-04-02 DIAGNOSIS — B9689 Other specified bacterial agents as the cause of diseases classified elsewhere: Secondary | ICD-10-CM | POA: Diagnosis not present

## 2021-04-02 MED ORDER — BENZONATATE 100 MG PO CAPS: 100.0000 mg | ORAL_CAPSULE | Freq: Three times a day (TID) | ORAL | 0 refills | Status: DC | PRN

## 2021-04-02 MED ORDER — DOXYCYCLINE HYCLATE 100 MG PO TABS: 100.0000 mg | ORAL_TABLET | Freq: Two times a day (BID) | ORAL | 0 refills | Status: DC

## 2021-04-02 NOTE — Progress Notes (Signed)
Virtual Visit Consent   Suzanne Miles, you are scheduled for a virtual visit with a [facility] provider today.     Just as with appointments in the office, your consent must be obtained to participate.  Your consent will be active for this visit and any virtual visit you may have with one of our providers in the next 365 days.     If you have a MyChart account, a copy of this consent can be sent to you electronically.  All virtual visits are billed to your insurance company just like a traditional visit in the office.    As this is a virtual visit, video technology does not allow for your provider to perform a traditional examination.  This may limit your provider's ability to fully assess your condition.  If your provider identifies any concerns that need to be evaluated in person or the need to arrange testing (such as labs, EKG, etc.), we will make arrangements to do so.     Although advances in technology are sophisticated, we cannot ensure that it will always work on either your end or our end.  If the connection with a video visit is poor, the visit may have to be switched to a telephone visit.  With either a video or telephone visit, we are not always able to ensure that we have a secure connection.     I need to obtain your verbal consent now.   Are you willing to proceed with your visit today?    [name] has provided verbal consent on 04/02/2021 for a virtual visit (video or telephone).   [name], [place]   Date: 04/02/2021 2:10 PM   Virtual Visit via Video Note   I, [name], connected with  [name]  ([id number], 1991/01/23) on 04/02/21 at  2:00 PM EST by a video-enabled telemedicine application and verified that I am speaking with the correct person using two identifiers.  Location: Patient: Virtual Visit Location Patient: Home Provider: Virtual Visit Location Provider: Home Office   I discussed the limitations of evaluation and management by  telemedicine and the availability of in person appointments. The patient expressed understanding and agreed to proceed.    History of Present Illness: Suzanne Miles is a 31 y.o. who identifies as a female who was assigned female at birth, and is being seen today for URI symptoms. Notes cough and chest congestion over the past several weeks, worsening in the past few days with more productive cough and thick green phlegm. Denies fever, chills or chest pain. Some loose stool yesterday but no issue today.   HPI: HPI  Problems: There are no problems to display for this patient.   Allergies: No Known Allergies Medications:  Current Outpatient Medications:    benzonatate (TESSALON) 100 MG capsule, Take 1 capsule (100 mg total) by mouth 3 (three) times daily as needed for cough., Disp: 30 capsule, Rfl: 0   doxycycline (VIBRA-TABS) 100 MG tablet, Take 1 tablet (100 mg total) by mouth 2 (two) times daily., Disp: 14 tablet, Rfl: 0  Observations/Objective: Patient is well-developed, well-nourished in no acute distress.  Resting comfortably at home.  Head is normocephalic, atraumatic.  No labored breathing. Speech is clear and coherent with logical content.  Patient is alert and oriented at baseline.   Assessment and Plan: 1. Acute bacterial bronchitis - benzonatate (TESSALON) 100 MG capsule; Take 1 capsule (100 mg total) by mouth 3 (three) times daily as needed for cough.  Dispense:  30 capsule; Refill: 0 - doxycycline (VIBRA-TABS) 100 MG tablet; Take 1 tablet (100 mg total) by mouth 2 (two) times daily.  Dispense: 14 tablet; Refill: 0  Rx Doxycycline.  Increase fluids.  Rest.  Saline nasal spray.  Probiotic.  Mucinex as directed.  Humidifier in bedroom. Tessalon per orders.  Call or return to clinic if symptoms are not improving.   Follow Up Instructions: I discussed the assessment and treatment plan with the patient. The patient was provided an opportunity to ask questions and all were answered.  The patient agreed with the plan and demonstrated an understanding of the instructions.  A copy of instructions were sent to the patient via MyChart unless otherwise noted below.   The patient was advised to call back or seek an in-person evaluation if the symptoms worsen or if the condition fails to improve as anticipated.  Time:  I spent 12 minutes with the patient via telehealth technology discussing the above problems/concerns.    [name], PA-C

## 2021-04-02 NOTE — Patient Instructions (Signed)
Rhona Raider, thank you for joining [name], PA-C for today's virtual visit.  While this provider is not your primary care provider (PCP), if your PCP is located in our provider database this encounter information will be shared with them immediately following your visit.  Consent: (Patient) Suzanne Miles provided verbal consent for this virtual visit at the beginning of the encounter.  Current Medications: No current outpatient medications on file.   Medications ordered in this encounter:  No orders of the defined types were placed in this encounter.    *If you need refills on other medications prior to your next appointment, please contact your pharmacy*  Follow-Up: Call back or seek an in-person evaluation if the symptoms worsen or if the condition fails to improve as anticipated.  Other Instructions Take antibiotic (Doxycycline) as directed.  Increase fluids.  Get plenty of rest. Use Mucinex for congestion. Tessalon for cough. Take a daily probiotic (I recommend Align or Culturelle, but even Activia Yogurt may be beneficial).  A humidifier placed in the bedroom may offer some relief for a dry, scratchy throat of nasal irritation.  Read information below on acute bronchitis. Please call or return to clinic if symptoms are not improving.  Acute Bronchitis Bronchitis is when the airways that extend from the windpipe into the lungs get red, puffy, and painful (inflamed). Bronchitis often causes thick spit (mucus) to develop. This leads to a cough. A cough is the most common symptom of bronchitis. In acute bronchitis, the condition usually begins suddenly and goes away over time (usually in 2 weeks). Smoking, allergies, and asthma can make bronchitis worse. Repeated episodes of bronchitis may cause more lung problems.  HOME CARE Rest. Drink enough fluids to keep your pee (urine) clear or pale yellow (unless you need to limit fluids as told by your doctor). Only take  over-the-counter or prescription medicines as told by your doctor. Avoid smoking and secondhand smoke. These can make bronchitis worse. If you are a smoker, think about using nicotine gum or skin patches. Quitting smoking will help your lungs heal faster. Reduce the chance of getting bronchitis again by: Washing your hands often. Avoiding people with cold symptoms. Trying not to touch your hands to your mouth, nose, or eyes. Follow up with your doctor as told.  GET HELP IF: Your symptoms do not improve after 1 week of treatment. Symptoms include: Cough. Fever. Coughing up thick spit. Body aches. Chest congestion. Chills. Shortness of breath. Sore throat.  GET HELP RIGHT AWAY IF:  You have an increased fever. You have chills. You have severe shortness of breath. You have bloody thick spit (sputum). You throw up (vomit) often. You lose too much body fluid (dehydration). You have a severe headache. You faint.  MAKE SURE YOU:  Understand these instructions. Will watch your condition. Will get help right away if you are not doing well or get worse. Document Released: 08/05/2007 Document Revised: 10/19/2012 Document Reviewed: 08/09/2012 [facility] Patient Information 2015 [place], [place]. This information is not intended to replace advice given to you by your health care provider. Make sure you discuss any questions you have with your health care provider.    If you have been instructed to have an in-person evaluation today at a local Urgent Care facility, please use the link below. It will take you to a list of all of our available [facility], including address, phone number and hours of operation. Please do not delay care.  [facility]  If you or a family member do not have a primary care provider, use the link below to schedule a visit and establish care. When you choose a [facility] primary care physician or advanced practice provider, you gain a  long-term partner in health. Find a Primary Care Provider  Learn more about [facility]'s in-office and virtual care options: [facility] - Get Care Now

## 2021-04-27 ENCOUNTER — Telehealth: Payer: No Typology Code available for payment source | Admitting: Physician Assistant

## 2021-04-27 ENCOUNTER — Encounter: Payer: Self-pay | Admitting: Physician Assistant

## 2021-04-27 DIAGNOSIS — M79644 Pain in right finger(s): Secondary | ICD-10-CM | POA: Diagnosis not present

## 2021-04-27 NOTE — Progress Notes (Signed)
Virtual Visit Consent   Suzanne Miles, you are scheduled for a virtual visit with a Suzanne Miles today.     Just as with appointments in the office, your consent must be obtained to participate.  Your consent will be active for this visit and any virtual visit you may have with one of our providers in the next 365 days.     If you have a MyChart account, a copy of this consent can be sent to you electronically.  All virtual visits are billed to your insurance company just like a traditional visit in the office.    As this is a virtual visit, video technology does not allow for your Miles to perform a traditional examination.  This may limit your Miles's ability to fully assess your condition.  If your Miles identifies any concerns that need to be evaluated in person or the need to arrange testing (such as labs, EKG, etc.), we will make arrangements to do so.     Although advances in technology are sophisticated, we cannot ensure that it will always work on either your end or our end.  If the connection with a video visit is poor, the visit may have to be switched to a telephone visit.  With either a video or telephone visit, we are not always able to ensure that we have a secure connection.     I need to obtain your verbal consent now.   Are you willing to proceed with your visit today?    Suzanne Miles has provided verbal consent on 04/27/2021 for a virtual visit (video or telephone).   Suzanne Rad, PA-C   Date: 04/27/2021 3:56 PM   Virtual Visit via Video Note   I, Suzanne Miles, connected with  Suzanne Miles  ([id number], 10/25/1990) on 04/27/21 at  3:30 PM EST by a video-enabled telemedicine application and verified that I am speaking with the correct person using two identifiers.  Location: Patient: Virtual Visit Location Patient: Home Miles: Virtual Visit Location Miles: Home Office   I discussed the limitations of evaluation and management by telemedicine  and the availability of in person appointments. The patient expressed understanding and agreed to proceed.    History of Present Illness: Suzanne Miles is a 31 y.o. who identifies as a female who was assigned female at birth, states that she accidentally slammed the tip of her right pinky finger in a door yesterday, states that she has had swelling, numbness and tingling in the area and states that the tip of her finger has started to turn purple.  States that she does have an artificial nail on that finger, was in the process of removing the rest of the artificial nails from that hand.  Does endorse pain in the area.  Has not tried anything for relief.  HPI: HPI  Problems: There are no problems to display for this patient.   Allergies: No Known Allergies Medications:  Current Outpatient Medications:    benzonatate (TESSALON) 100 MG capsule, Take 1 capsule (100 mg total) by mouth 3 (Suzanne) times daily as needed for cough., Disp: 30 capsule, Rfl: 0   doxycycline (VIBRA-TABS) 100 MG tablet, Take 1 tablet (100 mg total) by mouth 2 (two) times daily., Disp: 14 tablet, Rfl: 0  Observations/Objective: Patient is well-developed, well-nourished in no acute distress.  Resting comfortably  at home.  Head is normocephalic, atraumatic.  No labored breathing.  Speech is clear and coherent with logical content.  Patient is alert  and oriented at baseline.    Assessment and Plan: 1. Finger pain, right  Patient does require in person evaluation, directed patient to urgent care for prompt evaluation and treatment.  Patient understands and agrees  Follow Up Instructions: I discussed the assessment and treatment plan with the patient. The patient was provided an opportunity to ask questions and all were answered. The patient agreed with the plan and demonstrated an understanding of the instructions.  A copy of instructions were sent to the patient via MyChart unless otherwise noted below.     The  patient was advised to call back or seek an in-person evaluation if the symptoms worsen or if the condition fails to improve as anticipated.  Time:  I spent 12 minutes with the patient via telehealth technology discussing the above problems/concerns.    Suzanne Grip Mayers, PA-C

## 2021-04-27 NOTE — Patient Instructions (Signed)
°  Rhona Raider, thank you for joining Roney Jaffe, PA-C for today's virtual visit.  While this provider is not your primary care provider (PCP), if your PCP is located in our provider database this encounter information will be shared with them immediately following your visit.  Consent: (Patient) Suzanne Miles provided verbal consent for this virtual visit at the beginning of the encounter.  Current Medications:  Current Outpatient Medications:    benzonatate (TESSALON) 100 MG capsule, Take 1 capsule (100 mg total) by mouth 3 (three) times daily as needed for cough., Disp: 30 capsule, Rfl: 0   doxycycline (VIBRA-TABS) 100 MG tablet, Take 1 tablet (100 mg total) by mouth 2 (two) times daily., Disp: 14 tablet, Rfl: 0   Medications ordered in this encounter:  No orders of the defined types were placed in this encounter.    *If you need refills on other medications prior to your next appointment, please contact your pharmacy*  Follow-Up: Call back or seek an in-person evaluation if the symptoms worsen or if the condition fails to improve as anticipated.  Other Instructions You do require an in person evaluation of your right pinky finger injury.  I encourage you to present to a local urgent care facility, please use this link below to find urgent care locations.   If you have been instructed to have an in-person evaluation today at a local Urgent Care facility, please use the link below. It will take you to a list of all of our available [facility], including address, phone number and hours of operation. Please do not delay care.  [facility]  If you or a family member do not have a primary care provider, use the link below to schedule a visit and establish care. When you choose a [facility] primary care physician or advanced practice provider, you gain a long-term partner in health. Find a Primary Care Provider  Learn more about [facility]'s in-office and  virtual care options: [facility] - Get Care Now

## 2021-04-28 ENCOUNTER — Telehealth: Payer: No Typology Code available for payment source | Admitting: Physician Assistant

## 2021-04-28 DIAGNOSIS — L509 Urticaria, unspecified: Secondary | ICD-10-CM

## 2021-04-28 MED ORDER — PREDNISONE 10 MG (21) PO TBPK: ORAL_TABLET | ORAL | 0 refills | Status: DC

## 2021-04-28 MED ORDER — FAMOTIDINE 40 MG PO TABS: 40.0000 mg | ORAL_TABLET | Freq: Every day | ORAL | 0 refills | Status: DC

## 2021-04-28 NOTE — Progress Notes (Signed)
Virtual Visit Consent   Suzanne Miles, you are scheduled for a virtual visit with a [facility] provider today.     Just as with appointments in the office, your consent must be obtained to participate.  Your consent will be active for this visit and any virtual visit you may have with one of our providers in the next 365 days.     If you have a MyChart account, a copy of this consent can be sent to you electronically.  All virtual visits are billed to your insurance company just like a traditional visit in the office.    As this is a virtual visit, video technology does not allow for your provider to perform a traditional examination.  This may limit your provider's ability to fully assess your condition.  If your provider identifies any concerns that need to be evaluated in person or the need to arrange testing (such as labs, EKG, etc.), we will make arrangements to do so.     Although advances in technology are sophisticated, we cannot ensure that it will always work on either your end or our end.  If the connection with a video visit is poor, the visit may have to be switched to a telephone visit.  With either a video or telephone visit, we are not always able to ensure that we have a secure connection.     I need to obtain your verbal consent now.   Are you willing to proceed with your visit today?    [name] has provided verbal consent on 04/28/2021 for a virtual visit (video or telephone).   Margaretann Loveless, PA-C   Date: 04/28/2021 2:09 PM   Virtual Visit via Video Note   I, Margaretann Loveless, connected with  Suzanne Miles  ([id number], December 09, 1990) on 04/28/21 at  2:00 PM EST by a video-enabled telemedicine application and verified that I am speaking with the correct person using two identifiers.  Location: Patient: Virtual Visit Location Patient: Mobile Provider: Virtual Visit Location Provider: Home Office   I discussed the limitations of evaluation and management by  telemedicine and the availability of in person appointments. The patient expressed understanding and agreed to proceed.    History of Present Illness: Suzanne Miles is a 31 y.o. who identifies as a female who was assigned female at birth, and is being seen today for possible hives.  HPI: Urticaria This is a new problem. The current episode started in the past 7 days (about a week). The problem has been gradually worsening since onset. The rash is diffuse. The rash is characterized by redness, itchiness, swelling and burning. It is unknown if there was an exposure to a precipitant. Pertinent negatives include no shortness of breath. Past treatments include antihistamine and topical steroids (hydrocortisone and benadryl, claritin). The treatment provided no relief.     Problems: There are no problems to display for this patient.   Allergies: No Known Allergies Medications:  Current Outpatient Medications:    famotidine (PEPCID) 40 MG tablet, Take 1 tablet (40 mg total) by mouth daily., Disp: 15 tablet, Rfl: 0   predniSONE (STERAPRED UNI-PAK 21 TAB) 10 MG (21) TBPK tablet, 6 day taper; take as directed on package instructions, Disp: 21 tablet, Rfl: 0   benzonatate (TESSALON) 100 MG capsule, Take 1 capsule (100 mg total) by mouth 3 (three) times daily as needed for cough., Disp: 30 capsule, Rfl: 0   doxycycline (VIBRA-TABS) 100 MG tablet, Take 1 tablet (100 mg total)  by mouth 2 (two) times daily., Disp: 14 tablet, Rfl: 0  Observations/Objective: Patient is well-developed, well-nourished in no acute distress.  Resting comfortably   Head is normocephalic, atraumatic.  No labored breathing.  Speech is clear and coherent with logical content.  Patient is alert and oriented at baseline.  Large area on neck that is erythematous, pruritic, well-demarcated, and appears to have some mild papular appearance; states other similar areas on chest, stomach, sides, and legs  Assessment and Plan: 1.  Urticaria - predniSONE (STERAPRED UNI-PAK 21 TAB) 10 MG (21) TBPK tablet; 6 day taper; take as directed on package instructions  Dispense: 21 tablet; Refill: 0 - famotidine (PEPCID) 40 MG tablet; Take 1 tablet (40 mg total) by mouth daily.  Dispense: 15 tablet; Refill: 0  - Unknown trigger, but worsening and spreading over a week. - Add prednisone and famotidine - Continue benadryl and topical hydrocortisone - Cool showers - Cool compresses to prevent itching - Seek in person evaluation if symptoms worsen or fail to improve  Follow Up Instructions: I discussed the assessment and treatment plan with the patient. The patient was provided an opportunity to ask questions and all were answered. The patient agreed with the plan and demonstrated an understanding of the instructions.  A copy of instructions were sent to the patient via MyChart unless otherwise noted below.   The patient was advised to call back or seek an in-person evaluation if the symptoms worsen or if the condition fails to improve as anticipated.  Time:  I spent 11 minutes with the patient via telehealth technology discussing the above problems/concerns.    Margaretann Loveless, PA-C

## 2021-04-28 NOTE — Patient Instructions (Signed)
Rhona Raider, thank you for joining Margaretann Loveless, PA-C for today's virtual visit.  While this provider is not your primary care provider (PCP), if your PCP is located in our provider database this encounter information will be shared with them immediately following your visit.  Consent: (Patient) Suzanne Miles provided verbal consent for this virtual visit at the beginning of the encounter.  Current Medications:  Current Outpatient Medications:    famotidine (PEPCID) 40 MG tablet, Take 1 tablet (40 mg total) by mouth daily., Disp: 15 tablet, Rfl: 0   predniSONE (STERAPRED UNI-PAK 21 TAB) 10 MG (21) TBPK tablet, 6 day taper; take as directed on package instructions, Disp: 21 tablet, Rfl: 0   benzonatate (TESSALON) 100 MG capsule, Take 1 capsule (100 mg total) by mouth 3 (three) times daily as needed for cough., Disp: 30 capsule, Rfl: 0   doxycycline (VIBRA-TABS) 100 MG tablet, Take 1 tablet (100 mg total) by mouth 2 (two) times daily., Disp: 14 tablet, Rfl: 0   Medications ordered in this encounter:  Meds ordered this encounter  Medications   predniSONE (STERAPRED UNI-PAK 21 TAB) 10 MG (21) TBPK tablet    Sig: 6 day taper; take as directed on package instructions    Dispense:  21 tablet    Refill:  0    Order Specific Question:   Supervising Provider    Answer:   Hyacinth Meeker, BRIAN [[id number]]   famotidine (PEPCID) 40 MG tablet    Sig: Take 1 tablet (40 mg total) by mouth daily.    Dispense:  15 tablet    Refill:  0    Order Specific Question:   Supervising Provider    Answer:   Hyacinth Meeker, BRIAN [[id number]]     *If you need refills on other medications prior to your next appointment, please contact your pharmacy*  Follow-Up: Call back or seek an in-person evaluation if the symptoms worsen or if the condition fails to improve as anticipated.  Other Instructions Hives Hives (urticaria) are itchy, red, swollen areas on the skin. Hives can appear on any part of the body. Hives often fade  within 24 hours (acute hives). Sometimes, new hives appear after old ones fade and the cycle can continue for several days or weeks (chronic hives). Hives do not spread from person to person (are not contagious). Hives come from the body's reaction to something a person is allergic to (allergen), something that causes irritation, or various other triggers. When a person is exposed to a trigger, his or her body releases a chemical (histamine) that causes redness, itching, and swelling. Hives can appear right after exposure to a trigger or hours later. What are the causes? This condition may be caused by: Allergies to foods or ingredients. Insect bites or stings. Exposure to pollen or pets. Spending time in sunlight, heat, or cold (exposure). Exercise. Stress. You can also get hives from other medical conditions and treatments, such as: Viruses, including the common cold. Bacterial infections, such as urinary tract infections and strep throat. Certain medicines. Contact with latex or chemicals. Allergy shots. Blood transfusions. Sometimes, the cause of this condition is not known (idiopathic hives). What increases the risk? You are more likely to develop this condition if you: Are a woman. Have food allergies, especially to citrus fruits, milk, eggs, peanuts, tree nuts, or shellfish. Are allergic to: Medicines. Latex. Insects. Animals. Pollen. What are the signs or symptoms? Common symptoms of this condition include raised, itchy, red or white bumps or patches on your  skin. These areas may: Become large and swollen (welts). Change in shape and location, quickly and repeatedly. Be separate hives or connect over a large area of skin. Sting or become painful. Turn white when pressed in the center (blanch). In severe cases, your hands, feet, and face may also become swollen. This may occur if hives develop deeper in your skin. How is this diagnosed? This condition may be diagnosed by  your symptoms, medical history, and physical exam. Your skin, urine, or blood may be tested to find out what is causing your hives and to rule out other health issues. Your health care provider may also remove a small sample of skin from the affected area and examine it under a microscope (biopsy). How is this treated? Treatment for this condition depends on the cause and severity of your symptoms. Your health care provider may recommend using cool, wet cloths (cool compresses) or taking cool showers to relieve itching. Treatment may include: Medicines that help: Relieve itching (antihistamines). Reduce swelling (corticosteroids). Treat infection (antibiotics). An injectable medicine (omalizumab). Your health care provider may prescribe this if you have chronic idiopathic hives and you continue to have symptoms even after treatment with antihistamines. Severe cases may require an emergency injection of adrenaline (epinephrine) to prevent a life-threatening allergic reaction (anaphylaxis). Follow these instructions at home: Medicines Take and apply over-the-counter and prescription medicines only as told by your health care provider. If you were prescribed an antibiotic medicine, take it as told by your health care provider. Do not stop using the antibiotic even if you start to feel better. Skin care Apply cool compresses to the affected areas. Do not scratch or rub your skin. General instructions Do not take hot showers or baths. This can make itching worse. Do not wear tight-fitting clothing. Use sunscreen and wear protective clothing when you are outside. Avoid any substances that cause your hives. Keep a journal to help track what causes your hives. Write down: What medicines you take. What you eat and drink. What products you use on your skin. Keep all follow-up visits as told by your health care provider. This is important. Contact a health care provider if: Your symptoms are not  controlled with medicine. Your joints are painful or swollen. Get help right away if: You have a fever. You have pain in your abdomen. Your tongue or lips are swollen. Your eyelids are swollen. Your chest or throat feels tight. You have trouble breathing or swallowing. These symptoms may represent a serious problem that is an emergency. Do not wait to see if the symptoms will go away. Get medical help right away. Call your local emergency services (911 in the [place] Do not drive yourself to the hospital. Summary Hives (urticaria) are itchy, red, swollen areas on your skin. Hives come from the body's reaction to something a person is allergic to (allergen), something that causes irritation, or various other triggers. Treatment for this condition depends on the cause and severity of your symptoms. Avoid any substances that cause your hives. Keep a journal to help track what causes your hives. Take and apply over-the-counter and prescription medicines only as told by your health care provider. Get help right away if your chest or throat feels tight or if you have trouble breathing or swallowing. This information is not intended to replace advice given to you by your health care provider. Make sure you discuss any questions you have with your health care provider. Document Revised: 04/07/2020 Document Reviewed: 04/07/2020 Elsevier Patient  Education  2022 [employer].    If you have been instructed to have an in-person evaluation today at a local Urgent Care facility, please use the link below. It will take you to a list of all of our available [facility], including address, phone number and hours of operation. Please do not delay care.  [facility]  If you or a family member do not have a primary care provider, use the link below to schedule a visit and establish care. When you choose a [facility] primary care physician or advanced practice provider, you gain a  long-term partner in health. Find a Primary Care Provider  Learn more about [facility]'s in-office and virtual care options: [facility] - Get Care Now

## 2021-05-05 ENCOUNTER — Other Ambulatory Visit: Payer: Self-pay | Admitting: Physician Assistant

## 2021-05-05 DIAGNOSIS — L509 Urticaria, unspecified: Secondary | ICD-10-CM

## 2021-06-27 ENCOUNTER — Telehealth: Payer: No Typology Code available for payment source | Admitting: Emergency Medicine

## 2021-06-27 DIAGNOSIS — N760 Acute vaginitis: Secondary | ICD-10-CM

## 2021-06-27 MED ORDER — FLUCONAZOLE 150 MG PO TABS: 150.0000 mg | ORAL_TABLET | Freq: Once | ORAL | 0 refills | Status: AC

## 2021-06-27 NOTE — Progress Notes (Signed)
?Virtual Visit Consent  ? ?Suzanne Miles, you are scheduled for a virtual visit with a [facility] provider today.   ?  ?Just as with appointments in the office, your consent must be obtained to participate.  Your consent will be active for this visit and any virtual visit you may have with one of our providers in the next 365 days.   ?  ?If you have a MyChart account, a copy of this consent can be sent to you electronically.  All virtual visits are billed to your insurance company just like a traditional visit in the office.   ? ?As this is a virtual visit, video technology does not allow for your provider to perform a traditional examination.  This may limit your provider's ability to fully assess your condition.  If your provider identifies any concerns that need to be evaluated in person or the need to arrange testing (such as labs, EKG, etc.), we will make arrangements to do so.   ?  ?Although advances in technology are sophisticated, we cannot ensure that it will always work on either your end or our end.  If the connection with a video visit is poor, the visit may have to be switched to a telephone visit.  With either a video or telephone visit, we are not always able to ensure that we have a secure connection.    ? ?Also, by engaging in this virtual visit, you consent to the provision of healthcare. Additionally, you authorize for your insurance to be billed (if applicable) for the services provided during this visit.  ? ?I need to obtain your verbal consent now.   Are you willing to proceed with your visit today?  ?  ?Suzanne Miles has provided verbal consent on 06/27/2021 for a virtual visit (video or telephone). ?  ?Roxy Horseman, PA-C  ? ?Date: 06/27/2021 9:09 AM ? ? ?Virtual Visit via Video Note  ? Merwyn Katos, connected with  Suzanne Miles  ([id number], February 24, 1991) on 06/27/21 at  9:15 AM EDT by a video-enabled telemedicine application and verified that I am speaking with the correct person  using two identifiers. ? ?Location: ?Patient: Virtual Visit Location Patient: Home ?Provider: Virtual Visit Location Provider: Home Office ?  ?I discussed the limitations of evaluation and management by telemedicine and the availability of in person appointments. The patient expressed understanding and agreed to proceed.   ? ?History of Present Illness: ?Suzanne Miles is a 31 y.o. who identifies as a female who was assigned female at birth, and is being seen today for yeast infection.  States that she has tried some OTC meds from [facility].  States that she is having thick, white, chunky discharge.  States that it has been going on for a few days.  States that she hasn't been on any abx.  Denies foul smell or itching.  States that she had had yeast infections in the past and this is similar. ? ?HPI: HPI  ?Problems: There are no problems to display for this patient. ?  ?Allergies: No Known Allergies ?Medications:  ?Current Outpatient Medications:  ?  benzonatate (TESSALON) 100 MG capsule, Take 1 capsule (100 mg total) by mouth 3 (three) times daily as needed for cough., Disp: 30 capsule, Rfl: 0 ?  doxycycline (VIBRA-TABS) 100 MG tablet, Take 1 tablet (100 mg total) by mouth 2 (two) times daily., Disp: 14 tablet, Rfl: 0 ?  famotidine (PEPCID) 40 MG tablet, Take 1 tablet (40 mg total) by mouth daily., Disp:  15 tablet, Rfl: 0 ?  predniSONE (STERAPRED UNI-PAK 21 TAB) 10 MG (21) TBPK tablet, 6 day taper; take as directed on package instructions, Disp: 21 tablet, Rfl: 0 ? ?Observations/Objective: ?Patient is well-developed, well-nourished in no acute distress.  ?Resting comfortably  at home.  ?Head is normocephalic, atraumatic.  ?No labored breathing.  ?Speech is clear and coherent with logical content.  ?Patient is alert and oriented at baseline.  ? ? ?Assessment and Plan: ?1. Vaginosis ? ?- Diflucan 150mg  once.   ?- F/u in-person if not improving ? ?Follow Up Instructions: ?I discussed the assessment and treatment plan with  the patient. The patient was provided an opportunity to ask questions and all were answered. The patient agreed with the plan and demonstrated an understanding of the instructions.  A copy of instructions were sent to the patient via MyChart unless otherwise noted below.  ? ? ? ?The patient was advised to call back or seek an in-person evaluation if the symptoms worsen or if the condition fails to improve as anticipated. ? ?Time:  ?I spent 11 minutes with the patient via telehealth technology discussing the above problems/concerns.   ? ? , PA-C ? ?

## 2022-04-30 ENCOUNTER — Telehealth: Payer: No Typology Code available for payment source | Admitting: Nurse Practitioner

## 2022-04-30 DIAGNOSIS — J069 Acute upper respiratory infection, unspecified: Secondary | ICD-10-CM

## 2022-04-30 MED ORDER — IPRATROPIUM BROMIDE 0.03 % NA SOLN: 2.0000 | Freq: Two times a day (BID) | NASAL | 12 refills | Status: AC

## 2022-04-30 NOTE — Progress Notes (Signed)
Virtual Visit Consent   Suzanne Miles, you are scheduled for a virtual visit with a [facility] provider today. Just as with appointments in the office, your consent must be obtained to participate. Your consent will be active for this visit and any virtual visit you may have with one of our providers in the next 365 days. If you have a MyChart account, a copy of this consent can be sent to you electronically.  As this is a virtual visit, video technology does not allow for your provider to perform a traditional examination. This may limit your provider's ability to fully assess your condition. If your provider identifies any concerns that need to be evaluated in person or the need to arrange testing (such as labs, EKG, etc.), we will make arrangements to do so. Although advances in technology are sophisticated, we cannot ensure that it will always work on either your end or our end. If the connection with a video visit is poor, the visit may have to be switched to a telephone visit. With either a video or telephone visit, we are not always able to ensure that we have a secure connection.  By engaging in this virtual visit, you consent to the provision of healthcare and authorize for your insurance to be billed (if applicable) for the services provided during this visit. Depending on your insurance coverage, you may receive a charge related to this service.  I need to obtain your verbal consent now. Are you willing to proceed with your visit today? Suzanne Miles has provided verbal consent on 04/30/2022 for a virtual visit (video or telephone). Apolonio Schneiders, FNP  Date: 04/30/2022 12:15 PM  Virtual Visit via Video Note   I, Apolonio Schneiders, connected with  Suzanne Miles  ([id number], [birth year]) on 04/30/22 at 12:15 PM EST by a video-enabled telemedicine application and verified that I am speaking with the correct person using two identifiers.  Location: Patient: Virtual Visit Location Patient:  Home Provider: Virtual Visit Location Provider: Home Office   I discussed the limitations of evaluation and management by telemedicine and the availability of in person appointments. The patient expressed understanding and agreed to proceed.    History of Present Illness: Suzanne Miles is a 32 y.o. who identifies as a female who was assigned female at birth, and is being seen today for nasal congestion and fullness in her ear that she feels mostly when she swallow. She has a mild sore throat as well   She has been sick for the past 2-3 days.  She has not had a fever.   She has been using Mucinex for relief.   She does not have a cough but she is sneezing    Problems: There are no problems to display for this patient.   Allergies: No Known Allergies Medications: none    Observations/Objective: Patient is well-developed, well-nourished in no acute distress.  Resting comfortably  at home.  Head is normocephalic, atraumatic.  No labored breathing.  Speech is clear and coherent with logical content.  Patient is alert and oriented at baseline.    Assessment and Plan: 1. Viral upper respiratory tract infection  - ipratropium (ATROVENT) 0.03 % nasal spray; Place 2 sprays into both nostrils every 12 (twelve) hours.  Dispense: 30 mL; Refill: 12  Advised sudafed in combination with nasal spray for relief of congestion and ear pressure.  Follow up with persistent symptoms or new concerns       Follow Up Instructions: I discussed the assessment  and treatment plan with the patient. The patient was provided an opportunity to ask questions and all were answered. The patient agreed with the plan and demonstrated an understanding of the instructions.  A copy of instructions were sent to the patient via MyChart unless otherwise noted below.    The patient was advised to call back or seek an in-person evaluation if the symptoms worsen or if the condition fails to improve as  anticipated.  Time:  I spent 11 minutes with the patient via telehealth technology discussing the above problems/concerns.    Apolonio Schneiders, FNP

## 2022-07-08 ENCOUNTER — Telehealth: Payer: No Typology Code available for payment source

## 2022-07-08 DIAGNOSIS — H6690 Otitis media, unspecified, unspecified ear: Secondary | ICD-10-CM

## 2022-07-08 MED ORDER — AMOXICILLIN 875 MG PO TABS
875.0000 mg | ORAL_TABLET | Freq: Two times a day (BID) | ORAL | 0 refills | Status: AC
Start: 2022-07-08 — End: 2022-07-18

## 2022-07-08 MED ORDER — FLUTICASONE PROPIONATE 50 MCG/ACT NA SUSP: 2.0000 | Freq: Every day | NASAL | 6 refills | Status: AC

## 2022-07-08 NOTE — Progress Notes (Signed)
Virtual Visit Consent   Suzanne Miles, you are scheduled for a virtual visit with a [facility] provider today. Just as with appointments in the office, your consent must be obtained to participate. Your consent will be active for this visit and any virtual visit you may have with one of our providers in the next 365 days. If you have a MyChart account, a copy of this consent can be sent to you electronically.  As this is a virtual visit, video technology does not allow for your provider to perform a traditional examination. This may limit your provider's ability to fully assess your condition. If your provider identifies any concerns that need to be evaluated in person or the need to arrange testing (such as labs, EKG, etc.), we will make arrangements to do so. Although advances in technology are sophisticated, we cannot ensure that it will always work on either your end or our end. If the connection with a video visit is poor, the visit may have to be switched to a telephone visit. With either a video or telephone visit, we are not always able to ensure that we have a secure connection.  By engaging in this virtual visit, you consent to the provision of healthcare and authorize for your insurance to be billed (if applicable) for the services provided during this visit. Depending on your insurance coverage, you may receive a charge related to this service.  I need to obtain your verbal consent now. Are you willing to proceed with your visit today? Kasia Schauss has provided verbal consent on 07/08/2022 for a virtual visit (video or telephone). Viviano Simas, FNP  Date: 07/08/2022 8:00 AM  Virtual Visit via Video Note   I, Viviano Simas, connected with  Suzanne Miles  ([id number], 03-19-1990) on 07/08/22 at  8:00 AM EDT by a video-enabled telemedicine application and verified that I am speaking with the correct person using two identifiers.  Location: Patient: Virtual Visit Location Patient:  Home Provider: Virtual Visit Location Provider: Home Office   I discussed the limitations of evaluation and management by telemedicine and the availability of in person appointments. The patient expressed understanding and agreed to proceed.    History of Present Illness: Suzanne Miles is a 32 y.o. who identifies as a female who was assigned female at birth, and is being seen today for ongoing pressure in her ear.  She has had persistent sinus congestion that has seemed to move into her left ear  She recently flew home from [place] and felt worsening pain in her left ear and feels drainage "inside" that has continued to bother her  Denies any blood from ear or external drainage  She can hear from her ear   No fever  Denies other systemic symptoms   She has not had ear infections in the past   Problems: There are no problems to display for this patient.   Allergies: No Known Allergies Medications:  Current Outpatient Medications:    ipratropium (ATROVENT) 0.03 % nasal spray, Place 2 sprays into both nostrils every 12 (twelve) hours., Disp: 30 mL, Rfl: 12  Observations/Objective: Patient is well-developed, well-nourished in no acute distress.  Resting comfortably  at home.  Head is normocephalic, atraumatic.  No labored breathing.  Speech is clear and coherent with logical content.  Patient is alert and oriented at baseline.    Assessment and Plan: 1. Acute otitis media, unspecified otitis media type  - fluticasone (FLONASE) 50 MCG/ACT nasal spray; Place 2 sprays into both  nostrils daily.  Dispense: 16 g; Refill: 6 - amoxicillin (AMOXIL) 875 MG tablet; Take 1 tablet (875 mg total) by mouth 2 (two) times daily for 10 days.  Dispense: 20 tablet; Refill: 0    Use ibuprofen or advil OTC for relief of pain and inflammation  Up to 600mg  ibuprofen with food three times daily as needed   Follow Up Instructions: I discussed the assessment and treatment plan with the patient. The  patient was provided an opportunity to ask questions and all were answered. The patient agreed with the plan and demonstrated an understanding of the instructions.  A copy of instructions were sent to the patient via MyChart unless otherwise noted below.    The patient was advised to call back or seek an in-person evaluation if the symptoms worsen or if the condition fails to improve as anticipated.  Time:  I spent 12 minutes with the patient via telehealth technology discussing the above problems/concerns.    Viviano Simas, FNP

## 2022-12-01 ENCOUNTER — Telehealth: Payer: No Typology Code available for payment source

## 2022-12-01 DIAGNOSIS — R21 Rash and other nonspecific skin eruption: Secondary | ICD-10-CM

## 2022-12-02 NOTE — Progress Notes (Signed)
Because of need to truly make a formal diagnosis since there is some concern for a possible perioral dermatitis/fungal infection and not just an eczema present, I feel your condition warrants further evaluation and I recommend that you be seen in a face to face visit.   NOTE: There will be NO CHARGE for this eVisit   If you are having a true medical emergency please call 911.      For an urgent face to face visit, [facility] has eight urgent care centers for your convenience:   NEW!! [facility] Health Urgent Care Center at [facility] Get Driving Directions [phone] [address] [place], [address]    [facility] Health Urgent Care Center at [facility] Get Driving Directions [phone] [address] [place], [place] [address]   [facility] Health Urgent Care Center [facility]) Get Driving Directions [phone] [address] [place], [place] [address]  [facility] Health Urgent Care Center [facility]) Get Driving Directions [phone] [address] [place],  [place]  [address]  [facility] Health Urgent Care Center [facility] - at [employer]  [phone] [address] [place],  [place] [address]   [facility] Health Urgent Care at [facility] Get Driving Directions [phone] [address] [place], [place] [address]   [facility] Health Urgent Care at [facility] Get Driving Directions  [phone] [address] [place], [place] [address]   [facility] Health Urgent Care at [facility] Directions [phone] [address], [address] [place], [place] [address]  Your MyChart E-visit questionnaire answers were reviewed by a board certified advanced clinical practitioner to complete your personal care plan based on your specific symptoms.  Thank you for using e-Visits.

## 2023-03-11 ENCOUNTER — Telehealth: Payer: No Typology Code available for payment source | Admitting: Physician Assistant

## 2023-03-11 DIAGNOSIS — J208 Acute bronchitis due to other specified organisms: Secondary | ICD-10-CM

## 2023-03-11 DIAGNOSIS — L710 Perioral dermatitis: Secondary | ICD-10-CM

## 2023-03-11 DIAGNOSIS — B9689 Other specified bacterial agents as the cause of diseases classified elsewhere: Secondary | ICD-10-CM

## 2023-03-11 MED ORDER — AZITHROMYCIN 250 MG PO TABS
ORAL_TABLET | ORAL | 0 refills | Status: AC
Start: 2023-03-11 — End: 2023-03-16

## 2023-03-11 MED ORDER — METRONIDAZOLE 0.75 % EX CREA
TOPICAL_CREAM | Freq: Two times a day (BID) | CUTANEOUS | 0 refills | Status: AC
Start: 2023-03-11 — End: ?

## 2023-03-11 MED ORDER — PSEUDOEPH-BROMPHEN-DM 30-2-10 MG/5ML PO SYRP
5.0000 mL | ORAL_SOLUTION | Freq: Four times a day (QID) | ORAL | 0 refills | Status: AC | PRN
Start: 2023-03-11 — End: ?

## 2023-03-11 NOTE — Progress Notes (Signed)
 Virtual Visit Consent   Suzanne Miles, you are scheduled for a virtual visit with a [place] provider today. Just as with appointments in the office, your consent must be obtained to participate. Your consent will be active for this visit and any virtual visit you may have with one of our providers in the next 365 days. If you have a MyChart account, a copy of this consent can be sent to you electronically.  As this is a virtual visit, video technology does not allow for your provider to perform a traditional examination. This may limit your provider's ability to fully assess your condition. If your provider identifies any concerns that need to be evaluated in person or the need to arrange testing (such as labs, EKG, etc.), we will make arrangements to do so. Although advances in technology are sophisticated, we cannot ensure that it will always work on either your end or our end. If the connection with a video visit is poor, the visit may have to be switched to a telephone visit. With either a video or telephone visit, we are not always able to ensure that we have a secure connection.  By engaging in this virtual visit, you consent to the provision of healthcare and authorize for your insurance to be billed (if applicable) for the services provided during this visit. Depending on your insurance coverage, you may receive a charge related to this service.  I need to obtain your verbal consent now. Are you willing to proceed with your visit today? Suzanne Miles has provided verbal consent on 03/11/2023 for a virtual visit (video or telephone). Suzanne CHRISTELLA Dickinson, PA-C  Date: 03/11/2023 8:01 AM  Virtual Visit via Video Note   I, Suzanne Miles, connected with  Suzanne Miles  ([id number], [birth year]) on 03/11/23 at  8:00 AM EST by a video-enabled telemedicine application and verified that I am speaking with the correct person using two identifiers.  Location: Patient: Virtual Visit Location  Patient: Home Provider: Virtual Visit Location Provider: Home Office   I discussed the limitations of evaluation and management by telemedicine and the availability of in person appointments. The patient expressed understanding and agreed to proceed.    History of Present Illness: Suzanne Miles is a 33 y.o. who identifies as a female who was assigned female at birth, and is being seen today for cough and facial rash.  HPI: Cough This is a new problem. The current episode started more than 1 month ago (2 months). The problem has been waxing and waning. The cough is Non-productive (mostly dry but occasionally will have mucus production). Associated symptoms include nasal congestion and postnasal drip. Pertinent negatives include no chills, ear congestion, ear pain, fever, myalgias or sore throat. Nothing aggravates the symptoms. Treatments tried: Mucinex, delsym. The treatment provided no relief.    Problems: There are no active problems to display for this patient.   Allergies: No Known Allergies Medications:  Current Outpatient Medications:    fluticasone  (FLONASE ) 50 MCG/ACT nasal spray, Place 2 sprays into both nostrils daily., Disp: 16 g, Rfl: 6   ipratropium (ATROVENT ) 0.03 % nasal spray, Place 2 sprays into both nostrils every 12 (twelve) hours., Disp: 30 mL, Rfl: 12  Observations/Objective: Patient is well-developed, well-nourished in no acute distress.  Resting comfortably at home.  Head is normocephalic, atraumatic.  No labored breathing.  Speech is clear and coherent with logical content.  Patient is alert and oriented at baseline.    Assessment and Plan: There are no  diagnoses linked to this encounter. - Worsening over a week despite OTC medications - Will treat with Z-pack and Bromfed DM - Can continue Mucinex  - Push fluids.  - Rest.  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 The patient was provided an opportunity to ask questions and all were answered. The patient agreed with the plan and demonstrated an understanding of the instructions.  A copy of instructions were sent to the patient via MyChart unless otherwise noted below.    The patient was advised to call back or seek an in-person evaluation if the symptoms worsen or if the condition fails to improve as anticipated.    Suzanne CHRISTELLA Dickinson, PA-C

## 2023-03-11 NOTE — Patient Instructions (Signed)
 Suzanne Miles, thank you for joining Delon Suzanne Dickinson, PA-C for today's virtual visit.  While this provider is not your primary care provider (PCP), if your PCP is located in our provider database this encounter information will be shared with them immediately following your visit.   A [facility] MyChart account gives you access to today's visit and all your visits, tests, and labs performed at [facility]  click here if you don't have a [facility] MyChart account or go to [email or web address]  Consent: (Patient) Suzanne Miles provided verbal consent for this virtual visit at the beginning of the encounter.  Current Medications:  Current Outpatient Medications:    azithromycin  (ZITHROMAX ) 250 MG tablet, Take 2 tablets on day 1, then 1 tablet daily on days 2 through 5, Disp: 6 tablet, Rfl: 0   brompheniramine-pseudoephedrine-DM 30-2-10 MG/5ML syrup, Take 5 mLs by mouth 4 (four) times daily as needed., Disp: 120 mL, Rfl: 0   metroNIDAZOLE  (METROCREAM ) 0.75 % cream, Apply topically 2 (two) times daily., Disp: 45 g, Rfl: 0   fluticasone  (FLONASE ) 50 MCG/ACT nasal spray, Place 2 sprays into both nostrils daily., Disp: 16 g, Rfl: 6   ipratropium (ATROVENT ) 0.03 % nasal spray, Place 2 sprays into both nostrils every 12 (twelve) hours., Disp: 30 mL, Rfl: 12   Medications ordered in this encounter:  Meds ordered this encounter  Medications   metroNIDAZOLE  (METROCREAM ) 0.75 % cream    Sig: Apply topically 2 (two) times daily.    Dispense:  45 g    Refill:  0    Supervising Provider:   BLAISE ALEENE KIDD [[id number]]   azithromycin  (ZITHROMAX ) 250 MG tablet    Sig: Take 2 tablets on day 1, then 1 tablet daily on days 2 through 5    Dispense:  6 tablet    Refill:  0    Supervising Provider:   LAMPTEY, PHILIP O [[id number]]   brompheniramine-pseudoephedrine-DM 30-2-10 MG/5ML syrup    Sig: Take 5 mLs by mouth 4 (four) times daily as needed.    Dispense:  120 mL    Refill:  0     Supervising Provider:   BLAISE ALEENE KIDD [[id number]]     *If you need refills on other medications prior to your next appointment, please contact your pharmacy*  Follow-Up: Call back or seek an in-person evaluation if the symptoms worsen or if the condition fails to improve as anticipated.  [facility] [phone]  Other Instructions  Perioral dermatitis  Perioral dermatitis is an eruption which is usually located around the mouth and nose.  It can be a rash and/or red bumps.  It occasionally occurs around the eyes.  It may be itchy and may burn.  The exact cause is unknown.  Some types of makeup, moisturizers, dental products, and prescription creams may be partially responsible for the eruption.  Topical steroids such as cortisone creams can temporarily make the rash better but with discontinuation the rash tends to recur and worsen.  If you have been using topical steroids, your dermatologist may need to gradually taper the strength of steroids.  Topical antibiotics, elidel cream, protopic ointment, and oral antiobiotics may be prescribed to treat this condition.  Although perioral dermatitis is not an infection, some antibiotics have anti-inflammatory properties that help it greatly.   Acute Bronchitis, Adult  Acute bronchitis is sudden inflammation of the main airways (bronchi) that come off the windpipe (trachea) in the lungs. The swelling causes the airways to get smaller  and make more mucus than normal. This can make it hard to breathe and can cause coughing or noisy breathing (wheezing). Acute bronchitis may last several weeks. The cough may last longer. Allergies, asthma, and exposure to smoke may make the condition worse. What are the causes? This condition can be caused by germs and by substances that irritate the lungs, including: Cold and flu viruses. The most common cause of this condition is the virus that causes the common cold. Bacteria. This is less  common. Breathing in substances that irritate the lungs, including: Smoke from cigarettes and other forms of tobacco. Dust and pollen. Fumes from household cleaning products, gases, or burned fuel. Indoor or outdoor air pollution. What increases the risk? The following factors may make you more likely to develop this condition: A weak body's defense system, also called the immune system. A condition that affects your lungs and breathing, such as asthma. What are the signs or symptoms? Common symptoms of this condition include: Coughing. This may bring up clear, yellow, or green mucus from your lungs (sputum). Wheezing. Runny or stuffy nose. Having too much mucus in your lungs (chest congestion). Shortness of breath. Aches and pains, including sore throat or chest. How is this diagnosed? This condition is usually diagnosed based on: Your symptoms and medical history. A physical exam. You may also have other tests, including tests to rule out other conditions, such as pneumonia. These tests include: A test of lung function. Test of a mucus sample to look for the presence of bacteria. Tests to check the oxygen level in your blood. Blood tests. Chest X-ray. How is this treated? Most cases of acute bronchitis clear up over time without treatment. Your health care provider may recommend: Drinking more fluids to help thin your mucus so it is easier to cough up. Taking inhaled medicine (inhaler) to improve air flow in and out of your lungs. Using a vaporizer or a humidifier. These are machines that add water to the air to help you breathe better. Taking a medicine that thins mucus and clears congestion (expectorant). Taking a medicine that prevents or stops coughing (cough suppressant). It is not common to take an antibiotic medicine for this condition. Follow these instructions at home:  Take over-the-counter and prescription medicines only as told by your health care provider. Use an  inhaler, vaporizer, or humidifier as told by your health care provider. Take two teaspoons (10 mL) of honey at bedtime to lessen coughing at night. Drink enough fluid to keep your urine pale yellow. Do not use any products that contain nicotine or tobacco. These products include cigarettes, chewing tobacco, and vaping devices, such as e-cigarettes. If you need help quitting, ask your health care provider. Get plenty of rest. Return to your normal activities as told by your health care provider. Ask your health care provider what activities are safe for you. Keep all follow-up visits. This is important. How is this prevented? To lower your risk of getting this condition again: Wash your hands often with soap and water for at least 20 seconds. If soap and water are not available, use hand sanitizer. Avoid contact with people who have cold symptoms. Try not to touch your mouth, nose, or eyes with your hands. Avoid breathing in smoke or chemical fumes. Breathing smoke or chemical fumes will make your condition worse. Get the flu shot every year. Contact a health care provider if: Your symptoms do not improve after 2 weeks. You have trouble coughing up the mucus.  Your cough keeps you awake at night. You have a fever. Get help right away if you: Cough up blood. Feel pain in your chest. Have severe shortness of breath. Faint or keep feeling like you are going to faint. Have a severe headache. Have a fever or chills that get worse. These symptoms may represent a serious problem that is an emergency. Do not wait to see if the symptoms will go away. Get medical help right away. Call your local emergency services (911 in the [place] Do not drive yourself to the hospital. Summary Acute bronchitis is inflammation of the main airways (bronchi) that come off the windpipe (trachea) in the lungs. The swelling causes the airways to get smaller and make more mucus than normal. Drinking more fluids can help  thin your mucus so it is easier to cough up. Take over-the-counter and prescription medicines only as told by your health care provider. Do not use any products that contain nicotine or tobacco. These products include cigarettes, chewing tobacco, and vaping devices, such as e-cigarettes. If you need help quitting, ask your health care provider. Contact a health care provider if your symptoms do not improve after 2 weeks. This information is not intended to replace advice given to you by your health care provider. Make sure you discuss any questions you have with your health care provider. Document Revised: 05/29/2021 Document Reviewed: 06/19/2020 Elsevier Patient Education  2024 Elsevier Inc.    If you have been instructed to have an in-person evaluation today at a local Urgent Care facility, please use the link below. It will take you to a list of all of our available [facility], including address, phone number and hours of operation. Please do not delay care.  [facility]  If you or a family member do not have a primary care provider, use the link below to schedule a visit and establish care. When you choose a [facility] primary care physician or advanced practice provider, you gain a long-term partner in health. Find a Primary Care Provider  Learn more about [facility]'s in-office and virtual care options: [place] [facility] - Get Care Now

## 2024-03-07 ENCOUNTER — Telehealth: Admitting: Physician Assistant

## 2024-03-07 ENCOUNTER — Telehealth: Admitting: Family Medicine

## 2024-03-07 DIAGNOSIS — B9689 Other specified bacterial agents as the cause of diseases classified elsewhere: Secondary | ICD-10-CM

## 2024-03-07 DIAGNOSIS — J019 Acute sinusitis, unspecified: Secondary | ICD-10-CM

## 2024-03-07 DIAGNOSIS — R0602 Shortness of breath: Secondary | ICD-10-CM

## 2024-03-07 MED ORDER — AMOXICILLIN-POT CLAVULANATE 875-125 MG PO TABS: 1.0000 | ORAL_TABLET | Freq: Two times a day (BID) | ORAL | 0 refills | Status: AC

## 2024-03-07 NOTE — Patient Instructions (Signed)
" °  Avie Sharps, thank you for joining Chiquita CHRISTELLA Barefoot, NP for today's virtual visit.  While this provider is not your primary care provider (PCP), if your PCP is located in our provider database this encounter information will be shared with them immediately following your visit.   A [facility] MyChart account gives you access to today's visit and all your visits, tests, and labs performed at [facility]  click here if you don't have a [place] MyChart account or go to [email or web address]  Consent: (Patient) Bryauna Byrum provided verbal consent for this virtual visit at the beginning of the encounter.  Current Medications:  Current Outpatient Medications:    amoxicillin -clavulanate (AUGMENTIN ) 875-125 MG tablet, Take 1 tablet by mouth 2 (two) times daily for 7 days., Disp: 14 tablet, Rfl: 0   brompheniramine-pseudoephedrine-DM 30-2-10 MG/5ML syrup, Take 5 mLs by mouth 4 (four) times daily as needed., Disp: 120 mL, Rfl: 0   fluticasone  (FLONASE ) 50 MCG/ACT nasal spray, Place 2 sprays into both nostrils daily., Disp: 16 g, Rfl: 6   ipratropium (ATROVENT ) 0.03 % nasal spray, Place 2 sprays into both nostrils every 12 (twelve) hours., Disp: 30 mL, Rfl: 12   metroNIDAZOLE  (METROCREAM ) 0.75 % cream, Apply topically 2 (two) times daily., Disp: 45 g, Rfl: 0   Medications ordered in this encounter:  Meds ordered this encounter  Medications   amoxicillin -clavulanate (AUGMENTIN ) 875-125 MG tablet    Sig: Take 1 tablet by mouth 2 (two) times daily for 7 days.    Dispense:  14 tablet    Refill:  0    Supervising Provider:   BLAISE ALEENE KIDD [[id number]]     *If you need refills on other medications prior to your next appointment, please contact your pharmacy*  Follow-Up: Call back or seek an in-person evaluation if the symptoms worsen or if the condition fails to improve as anticipated.  [facility] [phone]  Other Instructions   URI recommendations: -  Increased rest - Increasing Fluids - Acetaminophen  / ibuprofen  as needed for fever/pain.  - Salt water gargling, chloraseptic spray and throat lozenges - Saline nasal spray if congestion or if nasal passages feel dry. - Humidifying the air.     If you have been instructed to have an in-person evaluation today at a local Urgent Care facility, please use the link below. It will take you to a list of all of our available [facility], including address, phone number and hours of operation. Please do not delay care.  [facility]  If you or a family member do not have a primary care provider, use the link below to schedule a visit and establish care. When you choose a [facility] primary care physician or advanced practice provider, you gain a long-term partner in health. Find a Primary Care Provider  Learn more about 's in-office and virtual care options:  - Get Care Now  "

## 2024-03-07 NOTE — Progress Notes (Signed)
 " Virtual Visit Consent   Suzanne Miles, you are scheduled for a virtual visit with a [place] provider today. Just as with appointments in the office, your consent must be obtained to participate. Your consent will be active for this visit and any virtual visit you may have with one of our providers in the next 365 days. If you have a MyChart account, a copy of this consent can be sent to you electronically.  As this is a virtual visit, video technology does not allow for your provider to perform a traditional examination. This may limit your provider's ability to fully assess your condition. If your provider identifies any concerns that need to be evaluated in person or the need to arrange testing (such as labs, EKG, etc.), we will make arrangements to do so. Although advances in technology are sophisticated, we cannot ensure that it will always work on either your end or our end. If the connection with a video visit is poor, the visit may have to be switched to a telephone visit. With either a video or telephone visit, we are not always able to ensure that we have a secure connection.  By engaging in this virtual visit, you consent to the provision of healthcare and authorize for your insurance to be billed (if applicable) for the services provided during this visit. Depending on your insurance coverage, you may receive a charge related to this service.  I need to obtain your verbal consent now. Are you willing to proceed with your visit today? Suzanne Miles has provided verbal consent on 03/07/2024 for a virtual visit (video or telephone). Suzanne CHRISTELLA Barefoot, NP  Date: 03/07/2024 12:48 PM   Virtual Visit via Video Note   I, Suzanne Miles, connected with  Suzanne Miles  ([id number], [birth year]) on 03/07/2024 at 12:45 PM EST by a video-enabled telemedicine application and verified that I am speaking with the correct person using two identifiers.  Location: Patient: Virtual Visit Location Patient:  Home Provider: Virtual Visit Location Provider: Home Office   I discussed the limitations of evaluation and management by telemedicine and the availability of in person appointments. The patient expressed understanding and agreed to proceed.    History of Present Illness: Suzanne Miles is a 34 y.o. who identifies as a female who was assigned female at birth, and is being seen today for flu exposure  Onset was a few weeks exposed to Flu, got sick for a week or so felt better after a week or so, then it came back with a lot of sinus symptoms Associated symptoms are cough, sore throat, congestion- nasal, fatigued, sinus pressure and tenderness, and ear drainage sound.  Modifying factors are day and ny quil, mucinex Denies chest pain, shortness of breath, fevers, chills  Exposure to sick contacts-  Flu A   Problems: There are no active problems to display for this patient.   Allergies: Allergies[1] Medications: Current Medications[2]  Observations/Objective: Patient is well-developed, well-nourished in no acute distress.  Resting comfortably  at home.  Head is normocephalic, atraumatic.  No labored breathing.  Speech is clear and coherent with logical content.  Patient is alert and oriented at baseline.  Congestion tone  Assessment and Plan:   1. Acute bacterial sinusitis (Primary)  - amoxicillin -clavulanate (AUGMENTIN ) 875-125 MG tablet; Take 1 tablet by mouth 2 (two) times daily for 7 days.  Dispense: 14 tablet; Refill: 0   URI recommendations: - Increased rest - Increasing Fluids - Acetaminophen  / ibuprofen  as needed for  fever/pain.  - Salt water gargling, chloraseptic spray and throat lozenges - Saline nasal spray if congestion or if nasal passages feel dry. - Humidifying the air.    Reviewed side effects, risks and benefits of medication.    Patient acknowledged agreement and understanding of the plan.   Past Medical, Surgical, Social History, Allergies, and  Medications have been Reviewed.    Follow Up Instructions: I discussed the assessment and treatment plan with the patient. The patient was provided an opportunity to ask questions and all were answered. The patient agreed with the plan and demonstrated an understanding of the instructions.  A copy of instructions were sent to the patient via MyChart unless otherwise noted below.    The patient was advised to call back or seek an in-person evaluation if the symptoms worsen or if the condition fails to improve as anticipated.    Suzanne CHRISTELLA Barefoot, NP     [1] No Known Allergies [2]  Current Outpatient Medications:    brompheniramine-pseudoephedrine-DM 30-2-10 MG/5ML syrup, Take 5 mLs by mouth 4 (four) times daily as needed., Disp: 120 mL, Rfl: 0   fluticasone  (FLONASE ) 50 MCG/ACT nasal spray, Place 2 sprays into both nostrils daily., Disp: 16 g, Rfl: 6   ipratropium (ATROVENT ) 0.03 % nasal spray, Place 2 sprays into both nostrils every 12 (twelve) hours., Disp: 30 mL, Rfl: 12   metroNIDAZOLE  (METROCREAM ) 0.75 % cream, Apply topically 2 (two) times daily., Disp: 45 g, Rfl: 0  "

## 2024-03-07 NOTE — Progress Notes (Signed)
" ° °  Thank you for the details you included in the comment boxes. Those details are very helpful in determining the best course of treatment for you and help us  to provide the best care.Because of duration of symptoms and associated shortness of breath, we recommend that you schedule a Virtual Urgent Care video visit in order for the provider to better assess what is going on.  The provider will be able to give you a more accurate diagnosis and treatment plan if we can more freely discuss your symptoms and with the addition of a virtual examination.   If you change your visit to a video visit, we will bill your insurance (similar to an office visit) and you will not be charged for this e-Visit. You will be able to stay at home and speak with the first available [facility] advanced practice provider. The link to do a video visit is in the drop down Menu tab of your Welcome screen in MyChart.    "
